# Patient Record
Sex: Female | Born: 1950 | Race: White | Hispanic: No | Marital: Married | Smoking: Never smoker
Health system: Southern US, Community
[De-identification: ages and names within clinical notes are randomized; demographics above are authoritative.]

## PROBLEM LIST (undated history)

## (undated) DIAGNOSIS — I10 Essential (primary) hypertension: Secondary | ICD-10-CM

## (undated) DIAGNOSIS — Z8719 Personal history of other diseases of the digestive system: Secondary | ICD-10-CM

## (undated) HISTORY — PX: ABDOMINAL HYSTERECTOMY: SHX81

## (undated) HISTORY — PX: TOTAL KNEE ARTHROPLASTY: SHX125

## (undated) HISTORY — PX: KNEE ARTHROSCOPY: SUR90

## (undated) HISTORY — PX: ADENOIDECTOMY: SUR15

## (undated) HISTORY — PX: KIDNEY DONATION: SHX685

## (undated) HISTORY — PX: OTHER SURGICAL HISTORY: SHX169

---

## 2015-01-24 ENCOUNTER — Other Ambulatory Visit: Payer: Self-pay

## 2015-01-24 DIAGNOSIS — Z1231 Encounter for screening mammogram for malignant neoplasm of breast: Secondary | ICD-10-CM

## 2015-03-06 ENCOUNTER — Ambulatory Visit
Admission: RE | Admit: 2015-03-06 | Discharge: 2015-03-06 | Disposition: A | Discharge status: Home / Self Care | Payer: PPO | Source: Ambulatory Visit

## 2015-03-06 DIAGNOSIS — Z1231 Encounter for screening mammogram for malignant neoplasm of breast: Secondary | ICD-10-CM

## 2015-04-26 DIAGNOSIS — H2512 Age-related nuclear cataract, left eye: Secondary | ICD-10-CM | POA: Diagnosis not present

## 2015-05-04 NOTE — Patient Instructions (Signed)
Your procedure is scheduled on: 05/09/2015  Report to [facility] at  700   AM.  Call this number if you have problems the morning of surgery: [phone]   Do not eat food or drink liquids :After Midnight.      Take these medicines the morning of surgery with A SIP OF WATER: none   Do not wear jewelry, make-up or nail polish.  Do not wear lotions, powders, or perfumes. You may wear deodorant.  Do not shave 48 hours prior to surgery.  Do not bring valuables to the hospital.  Contacts, dentures or bridgework may not be worn into surgery.  Leave suitcase in the car. After surgery it may be brought to your room.  For patients admitted to the hospital, checkout time is 11:00 AM the day of discharge.   Patients discharged the day of surgery will not be allowed to drive home.  :     Please read over the following fact sheets that you were given: Coughing and Deep Breathing, Surgical Site Infection Prevention, Anesthesia Post-op Instructions and Care and Recovery After Surgery    Cataract A cataract is a clouding of the lens of the eye. When a lens becomes cloudy, vision is reduced based on the degree and nature of the clouding. Many cataracts reduce vision to some degree. Some cataracts make people more near-sighted as they develop. Other cataracts increase glare. Cataracts that are ignored and become worse can sometimes look white. The white color can be seen through the pupil. CAUSES   Aging. However, cataracts may occur at any age, even in newborns.   Certain drugs.   Trauma to the eye.   Certain diseases such as diabetes.   Specific eye diseases such as chronic inflammation inside the eye or a sudden attack of a rare form of glaucoma.   Inherited or acquired medical problems.  SYMPTOMS   Gradual, progressive drop in vision in the affected eye.   Severe, rapid visual loss. This most often happens when trauma is the cause.  DIAGNOSIS  To detect a cataract, an eye doctor examines  the lens. Cataracts are best diagnosed with an exam of the eyes with the pupils enlarged (dilated) by drops.  TREATMENT  For an early cataract, vision may improve by using different eyeglasses or stronger lighting. If that does not help your vision, surgery is the only effective treatment. A cataract needs to be surgically removed when vision loss interferes with your everyday activities, such as driving, reading, or watching TV. A cataract may also have to be removed if it prevents examination or treatment of another eye problem. Surgery removes the cloudy lens and usually replaces it with a substitute lens (intraocular lens, IOL).  At a time when both you and your doctor agree, the cataract will be surgically removed. If you have cataracts in both eyes, only one is usually removed at a time. This allows the operated eye to heal and be out of danger from any possible problems after surgery (such as infection or poor wound healing). In rare cases, a cataract may be doing damage to your eye. In these cases, your caregiver may advise surgical removal right away. The vast majority of people who have cataract surgery have better vision afterward. HOME CARE INSTRUCTIONS  If you are not planning surgery, you may be asked to do the following:  Use different eyeglasses.   Use stronger or brighter lighting.   Ask your eye doctor about reducing your medicine dose  or changing medicines if it is thought that a medicine caused your cataract. Changing medicines does not make the cataract go away on its own.   Become familiar with your surroundings. Poor vision can lead to injury. Avoid bumping into things on the affected side. You are at a higher risk for tripping or falling.   Exercise extreme care when driving or operating machinery.   Wear sunglasses if you are sensitive to bright light or experiencing problems with glare.  SEEK IMMEDIATE MEDICAL CARE IF:   You have a worsening or sudden vision loss.    You notice redness, swelling, or increasing pain in the eye.   You have a fever.  Document Released: 03/11/2005 Document Revised: 02/28/2011 Document Reviewed: 11/02/2010 [facility] Patient Information 2012 [place].PATIENT INSTRUCTIONS POST-ANESTHESIA  IMMEDIATELY FOLLOWING SURGERY:  Do not drive or operate machinery for the first twenty four hours after surgery.  Do not make any important decisions for twenty four hours after surgery or while taking narcotic pain medications or sedatives.  If you develop intractable nausea and vomiting or a severe headache please notify your doctor immediately.  FOLLOW-UP:  Please make an appointment with your surgeon as instructed. You do not need to follow up with anesthesia unless specifically instructed to do so.  WOUND CARE INSTRUCTIONS (if applicable):  Keep a dry clean dressing on the anesthesia/puncture wound site if there is drainage.  Once the wound has quit draining you may leave it open to air.  Generally you should leave the bandage intact for twenty four hours unless there is drainage.  If the epidural site drains for more than 36-48 hours please call the anesthesia department.  QUESTIONS?:  Please feel free to call your physician or the hospital operator if you have any questions, and they will be happy to assist you.

## 2015-05-05 ENCOUNTER — Other Ambulatory Visit: Payer: Self-pay

## 2015-05-05 ENCOUNTER — Encounter (HOSPITAL_COMMUNITY): Payer: Self-pay

## 2015-05-05 ENCOUNTER — Encounter (HOSPITAL_COMMUNITY)
Admission: RE | Admit: 2015-05-05 | Discharge: 2015-05-05 | Disposition: A | Discharge status: Home / Self Care | Payer: PPO | Source: Ambulatory Visit | Attending: Ophthalmology | Admitting: Ophthalmology

## 2015-05-05 HISTORY — DX: Personal history of other diseases of the digestive system: Z87.19

## 2015-05-05 NOTE — Pre-Procedure Instructions (Signed)
Patient given information to sign up for my chart at home. 

## 2015-05-08 MED ORDER — PHENYLEPHRINE HCL 2.5 % OP SOLN
OPHTHALMIC | Status: AC
  Filled 2015-05-08: qty 15

## 2015-05-08 MED ORDER — CYCLOPENTOLATE-PHENYLEPHRINE OP SOLN OPTIME - NO CHARGE
OPHTHALMIC | Status: AC
  Filled 2015-05-08: qty 2

## 2015-05-08 MED ORDER — TETRACAINE HCL 0.5 % OP SOLN
OPHTHALMIC | Status: AC
  Filled 2015-05-08: qty 4

## 2015-05-08 MED ORDER — KETOROLAC TROMETHAMINE 0.5 % OP SOLN
OPHTHALMIC | Status: AC
  Filled 2015-05-08: qty 5

## 2015-05-09 ENCOUNTER — Encounter (HOSPITAL_COMMUNITY)
Admission: RE | Disposition: A | Discharge status: Home / Self Care | Payer: Self-pay | Source: Ambulatory Visit | Attending: Ophthalmology

## 2015-05-09 ENCOUNTER — Ambulatory Visit (HOSPITAL_COMMUNITY): Payer: PPO | Admitting: Anesthesiology

## 2015-05-09 ENCOUNTER — Encounter (HOSPITAL_COMMUNITY): Payer: Self-pay | Admitting: Anesthesiology

## 2015-05-09 ENCOUNTER — Ambulatory Visit (HOSPITAL_COMMUNITY)
Admission: RE | Admit: 2015-05-09 | Discharge: 2015-05-09 | Disposition: A | Discharge status: Home / Self Care | Payer: PPO | Source: Ambulatory Visit | Attending: Ophthalmology | Admitting: Ophthalmology

## 2015-05-09 DIAGNOSIS — H269 Unspecified cataract: Secondary | ICD-10-CM | POA: Diagnosis not present

## 2015-05-09 DIAGNOSIS — H2511 Age-related nuclear cataract, right eye: Secondary | ICD-10-CM | POA: Diagnosis not present

## 2015-05-09 DIAGNOSIS — M1991 Primary osteoarthritis, unspecified site: Secondary | ICD-10-CM | POA: Insufficient documentation

## 2015-05-09 DIAGNOSIS — H2512 Age-related nuclear cataract, left eye: Secondary | ICD-10-CM | POA: Diagnosis not present

## 2015-05-09 DIAGNOSIS — H268 Other specified cataract: Secondary | ICD-10-CM | POA: Diagnosis not present

## 2015-05-09 HISTORY — PX: CATARACT EXTRACTION W/PHACO: SHX586

## 2015-05-09 LAB — GLUCOSE, CAPILLARY: Glucose-Capillary: 102 mg/dL — ABNORMAL HIGH (ref 65–99)

## 2015-05-09 SURGERY — PHACOEMULSIFICATION, CATARACT, WITH IOL INSERTION
Anesthesia: Monitor Anesthesia Care | Site: Eye | Laterality: Left

## 2015-05-09 MED ORDER — KETOROLAC TROMETHAMINE 0.5 % OP SOLN
1.0000 [drp] | OPHTHALMIC | Status: AC
  Administered 2015-05-09 (×3): 1 [drp] via OPHTHALMIC

## 2015-05-09 MED ORDER — FENTANYL CITRATE (PF) 100 MCG/2ML IJ SOLN
25.0000 ug | Freq: Once | INTRAMUSCULAR | Status: AC
  Administered 2015-05-09: 25 ug via INTRAVENOUS

## 2015-05-09 MED ORDER — CYCLOPENTOLATE-PHENYLEPHRINE 0.2-1 % OP SOLN
1.0000 [drp] | OPHTHALMIC | Status: AC
  Administered 2015-05-09 (×3): 1 [drp] via OPHTHALMIC

## 2015-05-09 MED ORDER — PROVISC 10 MG/ML IO SOLN
INTRAOCULAR | Status: DC | PRN
  Administered 2015-05-09: 0.85 mL via INTRAOCULAR

## 2015-05-09 MED ORDER — FENTANYL CITRATE (PF) 100 MCG/2ML IJ SOLN
INTRAMUSCULAR | Status: AC
  Filled 2015-05-09: qty 2

## 2015-05-09 MED ORDER — PHENYLEPHRINE HCL 2.5 % OP SOLN
1.0000 [drp] | OPHTHALMIC | Status: AC
  Administered 2015-05-09 (×3): 1 [drp] via OPHTHALMIC

## 2015-05-09 MED ORDER — BSS IO SOLN
INTRAOCULAR | Status: DC | PRN
  Administered 2015-05-09: 15 mL

## 2015-05-09 MED ORDER — MIDAZOLAM HCL 2 MG/2ML IJ SOLN
INTRAMUSCULAR | Status: AC
  Filled 2015-05-09: qty 2

## 2015-05-09 MED ORDER — MIDAZOLAM HCL 2 MG/2ML IJ SOLN
1.0000 mg | INTRAMUSCULAR | Status: DC | PRN
  Administered 2015-05-09: 1 mg via INTRAVENOUS

## 2015-05-09 MED ORDER — EPINEPHRINE HCL 1 MG/ML IJ SOLN
INTRAOCULAR | Status: DC | PRN
  Administered 2015-05-09: 500 mL

## 2015-05-09 MED ORDER — EPINEPHRINE HCL 1 MG/ML IJ SOLN
INTRAMUSCULAR | Status: AC
  Filled 2015-05-09: qty 1

## 2015-05-09 MED ORDER — TETRACAINE 0.5 % OP SOLN OPTIME - NO CHARGE
OPHTHALMIC | Status: DC | PRN
  Administered 2015-05-09: 2 [drp] via OPHTHALMIC

## 2015-05-09 MED ORDER — TETRACAINE HCL 0.5 % OP SOLN
1.0000 [drp] | OPHTHALMIC | Status: AC
  Administered 2015-05-09 (×3): 1 [drp] via OPHTHALMIC

## 2015-05-09 MED ORDER — LACTATED RINGERS IV SOLN
INTRAVENOUS | Status: DC
  Administered 2015-05-09: 09:00:00 via INTRAVENOUS

## 2015-05-09 SURGICAL SUPPLY — 10 items
CLOTH BEACON ORANGE TIMEOUT ST (SAFETY) ×2 IMPLANT
EYE SHIELD UNIVERSAL CLEAR (GAUZE/BANDAGES/DRESSINGS) ×2 IMPLANT
GLOVE BIOGEL PI IND STRL 7.0 (GLOVE) ×1 IMPLANT
GLOVE BIOGEL PI INDICATOR 7.0 (GLOVE) ×1
GLOVE EXAM NITRILE MD LF STRL (GLOVE) ×2 IMPLANT
LENS ALC ACRYL/TECN (Ophthalmic Related) ×2 IMPLANT
PAD ARMBOARD 7.5X6 YLW CONV (MISCELLANEOUS) ×2 IMPLANT
TAPE SURG TRANSPORE 1 IN (GAUZE/BANDAGES/DRESSINGS) ×1 IMPLANT
TAPE SURGICAL TRANSPORE 1 IN (GAUZE/BANDAGES/DRESSINGS) ×1
WATER STERILE IRR 250ML POUR (IV SOLUTION) ×2 IMPLANT

## 2015-05-09 NOTE — Anesthesia Preprocedure Evaluation (Addendum)
Anesthesia Evaluation  Patient identified by MRN, date of birth, ID band Patient awake    Reviewed: Allergy & Precautions, NPO status , Patient's Chart, lab work & pertinent test results  Airway Mallampati: II  TM Distance: >3 FB     Dental  (+) Teeth Intact   Pulmonary neg pulmonary ROS,    breath sounds clear to auscultation       Cardiovascular negative cardio ROS   Rhythm:Regular Rate:Normal     Neuro/Psych    GI/Hepatic hiatal hernia,   Endo/Other    Renal/GU      Musculoskeletal   Abdominal   Peds  Hematology   Anesthesia Other Findings   Reproductive/Obstetrics                             Anesthesia Physical Anesthesia Plan  ASA: I  Anesthesia Plan: MAC   Post-op Pain Management:    Induction: Intravenous  Airway Management Planned: Nasal Cannula  Additional Equipment:   Intra-op Plan:   Post-operative Plan:   Informed Consent: I have reviewed the patients History and Physical, chart, labs and discussed the procedure including the risks, benefits and alternatives for the proposed anesthesia with the patient or authorized representative who has indicated his/her understanding and acceptance.     Plan Discussed with:   Anesthesia Plan Comments:         Anesthesia Quick Evaluation  

## 2015-05-09 NOTE — Op Note (Signed)
Patient brought to the operating room and prepped and draped in the usual manner.  Lid speculum inserted in left eye.  Stab incision made at the twelve o'clock position.  Provisc instilled in the anterior chamber.   A 2.4 mm. Stab incision was made temporally.  An anterior capsulotomy was done with a bent 25 gauge needle.  The nucleus was hydrodissected.  The Phaco tip was inserted in the anterior chamber and the nucleus was emulsified.  CDE was 4.83.  The cortical material was then removed with the I and A tip.  Posterior capsule was the polished.  The anterior chamber was deepened with Provisc.  A 26.5 Diopter Alcon SN60WF IOL was then inserted in the capsular bag.  Provisc was then removed with the I and A tip.  The wound was then hydrated.  Patient sent to the Recovery Room in good condition with follow up in my office.  Preoperative Diagnosis:  Nuclear Cataract OS Postoperative Diagnosis:  Same Procedure name: Kelman Phacoemulsification OS with IOL

## 2015-05-09 NOTE — Discharge Instructions (Signed)
°  °          [facility] Instructions °[address]- [place] [address]-[place] °    ° °1. Avoid closing eyes tightly. One often closes the eye tightly when laughing, talking, sneezing, coughing or if they feel irritated. At these times, you should be careful not to close your eyes tightly. ° °2. Instill eye drops as instructed. To instill drops in your eye, open it, look up and have someone gently pull the lower lid down and instill a couple of drops inside the lower lid. ° °3. Do not touch upper lid. ° °4. Take Advil or Tylenol for pain. ° °5. You may use either eye for near work, such as reading or sewing and you may watch television. ° °6. You may have your hair done at the beauty parlor at any time. ° °7. Wear dark glasses with or without your own glasses if you are in bright light. ° °8. Call our office at [phone] or [phone] if you have sharp pain in your eye or unusual symptoms. ° °9.  FOLLOW UP WITH DR. SHAPIRO TODAY IN HIS [place] OFFICE AT 2:45pm. ° °  °I have received a copy of the above instructions and will follow them.  ° ° ° °IF YOU ARE IN IMMEDIATE DANGER CALL 911! ° °It is important for you to keep your follow-up appointment with your physician after discharge, OR, for you /your caregiver to make a follow-up appointment with your physician / medical provider after discharge. ° °Show these instructions to the next healthcare provider you see. °PATIENT INSTRUCTIONS °POST-ANESTHESIA ° °IMMEDIATELY FOLLOWING SURGERY:  Do not drive or operate machinery for the first twenty four hours after surgery.  Do not make any important decisions for twenty four hours after surgery or while taking narcotic pain medications or sedatives.  If you develop intractable nausea and vomiting or a severe headache please notify your doctor immediately. ° °FOLLOW-UP:  Please make an appointment with your surgeon as instructed. You do not need to follow up with anesthesia unless  specifically instructed to do so. ° °WOUND CARE INSTRUCTIONS (if applicable):  Keep a dry clean dressing on the anesthesia/puncture wound site if there is drainage.  Once the wound has quit draining you may leave it open to air.  Generally you should leave the bandage intact for twenty four hours unless there is drainage.  If the epidural site drains for more than 36-48 hours please call the anesthesia department. ° °QUESTIONS?:  Please feel free to call your physician or the hospital operator if you have any questions, and they will be happy to assist you.    ° ° ° °

## 2015-05-09 NOTE — Transfer of Care (Signed)
Immediate Anesthesia Transfer of Care Note  Patient: Kristina Mcpherson  Procedure(s) Performed: Procedure(s) with comments: CATARACT EXTRACTION PHACO AND INTRAOCULAR LENS PLACEMENT (IOC) (Left) - CDE:4.83  Patient Location: Short Stay  Anesthesia Type:MAC  Level of Consciousness: awake, alert , oriented and patient cooperative  Airway & Oxygen Therapy: Patient Spontanous Breathing  Post-op Assessment: Report given to RN, Post -op Vital signs reviewed and stable and Patient moving all extremities  Post vital signs: Reviewed and stable  Last Vitals:  Filed Vitals:   05/09/15 0900 05/09/15 0905  BP: 197/93 169/131  Pulse:    Temp:    Resp: 33 18    Complications: No apparent anesthesia complications

## 2015-05-09 NOTE — H&P (Signed)
The patient was re examined and there is no change in the patients condition since the original H and P. 

## 2015-05-09 NOTE — Anesthesia Postprocedure Evaluation (Signed)
Anesthesia Post Note  Patient: Kristina Mcpherson  Procedure(s) Performed: Procedure(s) (LRB): CATARACT EXTRACTION PHACO AND INTRAOCULAR LENS PLACEMENT (IOC) (Left)  Patient location during evaluation: Short Stay Anesthesia Type: MAC Level of consciousness: awake and alert, oriented and patient cooperative Pain management: pain level controlled Vital Signs Assessment: post-procedure vital signs reviewed and stable Respiratory status: spontaneous breathing Cardiovascular status: blood pressure returned to baseline and stable Postop Assessment: adequate PO intake and no signs of nausea or vomiting Anesthetic complications: no    Last Vitals:  Filed Vitals:   05/09/15 0900 05/09/15 0905  BP: 197/93 169/131  Pulse:    Temp:    Resp: 33 18    Last Pain: There were no vitals filed for this visit.               [name] J

## 2015-05-10 ENCOUNTER — Encounter (HOSPITAL_COMMUNITY): Payer: Self-pay | Admitting: Ophthalmology

## 2015-05-18 ENCOUNTER — Encounter (HOSPITAL_COMMUNITY): Payer: Self-pay | Admitting: *Deleted

## 2015-05-19 ENCOUNTER — Encounter (HOSPITAL_COMMUNITY): Admission: RE | Admit: 2015-05-19 | Payer: PPO | Source: Ambulatory Visit

## 2015-05-23 ENCOUNTER — Ambulatory Visit (HOSPITAL_COMMUNITY): Payer: PPO | Admitting: Anesthesiology

## 2015-05-23 ENCOUNTER — Encounter (HOSPITAL_COMMUNITY): Payer: Self-pay | Admitting: *Deleted

## 2015-05-23 ENCOUNTER — Encounter (HOSPITAL_COMMUNITY)
Admission: RE | Disposition: A | Discharge status: Home / Self Care | Payer: Self-pay | Source: Ambulatory Visit | Attending: Ophthalmology

## 2015-05-23 ENCOUNTER — Ambulatory Visit (HOSPITAL_COMMUNITY)
Admission: RE | Admit: 2015-05-23 | Discharge: 2015-05-23 | Disposition: A | Discharge status: Home / Self Care | Payer: PPO | Source: Ambulatory Visit | Attending: Ophthalmology | Admitting: Ophthalmology

## 2015-05-23 DIAGNOSIS — H268 Other specified cataract: Secondary | ICD-10-CM | POA: Insufficient documentation

## 2015-05-23 DIAGNOSIS — H269 Unspecified cataract: Secondary | ICD-10-CM | POA: Diagnosis not present

## 2015-05-23 DIAGNOSIS — H2511 Age-related nuclear cataract, right eye: Secondary | ICD-10-CM | POA: Diagnosis not present

## 2015-05-23 DIAGNOSIS — M1991 Primary osteoarthritis, unspecified site: Secondary | ICD-10-CM | POA: Diagnosis not present

## 2015-05-23 HISTORY — PX: CATARACT EXTRACTION W/PHACO: SHX586

## 2015-05-23 SURGERY — PHACOEMULSIFICATION, CATARACT, WITH IOL INSERTION
Anesthesia: Monitor Anesthesia Care | Site: Eye | Laterality: Right

## 2015-05-23 MED ORDER — PROVISC 10 MG/ML IO SOLN
INTRAOCULAR | Status: DC | PRN
  Administered 2015-05-23: 0.85 mL via INTRAOCULAR

## 2015-05-23 MED ORDER — EPINEPHRINE HCL 1 MG/ML IJ SOLN
INTRAMUSCULAR | Status: AC
  Filled 2015-05-23: qty 1

## 2015-05-23 MED ORDER — TETRACAINE 0.5 % OP SOLN OPTIME - NO CHARGE
OPHTHALMIC | Status: DC | PRN
  Administered 2015-05-23: 2 [drp] via OPHTHALMIC

## 2015-05-23 MED ORDER — KETOROLAC TROMETHAMINE 0.5 % OP SOLN
1.0000 [drp] | OPHTHALMIC | Status: AC | PRN
  Administered 2015-05-23 (×3): 1 [drp] via OPHTHALMIC

## 2015-05-23 MED ORDER — BSS IO SOLN
INTRAOCULAR | Status: DC | PRN
  Administered 2015-05-23: 15 mL

## 2015-05-23 MED ORDER — PHENYLEPHRINE HCL 2.5 % OP SOLN
1.0000 [drp] | OPHTHALMIC | Status: AC | PRN
  Administered 2015-05-23 (×3): 1 [drp] via OPHTHALMIC

## 2015-05-23 MED ORDER — EPINEPHRINE HCL 1 MG/ML IJ SOLN
INTRAOCULAR | Status: DC | PRN
  Administered 2015-05-23: 500 mL

## 2015-05-23 MED ORDER — MIDAZOLAM HCL 2 MG/2ML IJ SOLN
INTRAMUSCULAR | Status: AC
  Filled 2015-05-23: qty 2

## 2015-05-23 MED ORDER — TETRACAINE HCL 0.5 % OP SOLN
1.0000 [drp] | OPHTHALMIC | Status: AC | PRN
  Administered 2015-05-23 (×3): 1 [drp] via OPHTHALMIC

## 2015-05-23 MED ORDER — CYCLOPENTOLATE-PHENYLEPHRINE 0.2-1 % OP SOLN
1.0000 [drp] | OPHTHALMIC | Status: AC | PRN
  Administered 2015-05-23 (×3): 1 [drp] via OPHTHALMIC

## 2015-05-23 MED ORDER — MIDAZOLAM HCL 2 MG/2ML IJ SOLN
1.0000 mg | Freq: Once | INTRAMUSCULAR | Status: AC
  Administered 2015-05-23: 0.5 mg via INTRAVENOUS

## 2015-05-23 MED ORDER — LACTATED RINGERS IV SOLN
INTRAVENOUS | Status: DC
  Administered 2015-05-23: 08:00:00 via INTRAVENOUS

## 2015-05-23 SURGICAL SUPPLY — 10 items
CLOTH BEACON ORANGE TIMEOUT ST (SAFETY) ×2 IMPLANT
EYE SHIELD UNIVERSAL CLEAR (GAUZE/BANDAGES/DRESSINGS) ×2 IMPLANT
GLOVE BIOGEL PI IND STRL 7.0 (GLOVE) ×1 IMPLANT
GLOVE BIOGEL PI INDICATOR 7.0 (GLOVE) ×1
GLOVE EXAM NITRILE MD LF STRL (GLOVE) ×2 IMPLANT
LENS ALC ACRYL/TECN (Ophthalmic Related) ×2 IMPLANT
PAD ARMBOARD 7.5X6 YLW CONV (MISCELLANEOUS) ×2 IMPLANT
TAPE SURG TRANSPORE 1 IN (GAUZE/BANDAGES/DRESSINGS) ×1 IMPLANT
TAPE SURGICAL TRANSPORE 1 IN (GAUZE/BANDAGES/DRESSINGS) ×1
WATER STERILE IRR 250ML POUR (IV SOLUTION) ×2 IMPLANT

## 2015-05-23 NOTE — Anesthesia Postprocedure Evaluation (Signed)
  Anesthesia Post-op Note  Patient: Kristina Mcpherson  Procedure(s) Performed: Procedure(s) (LRB): CATARACT EXTRACTION PHACO AND INTRAOCULAR LENS PLACEMENT (IOC) (Right)  Patient Location:  Short Stay  Anesthesia Type: MAC  Level of Consciousness: awake  Airway and Oxygen Therapy: Patient Spontanous Breathing  Post-op Pain: none  Post-op Assessment: Post-op Vital signs reviewed, Patient's Cardiovascular Status Stable, Respiratory Function Stable, Patent Airway, No signs of Nausea or vomiting and Pain level controlled  Post-op Vital Signs: Reviewed and stable  Complications: No apparent anesthesia complications

## 2015-05-23 NOTE — H&P (Signed)
The patient was re examined and there is no change in the patients condition since the original H and P. 

## 2015-05-23 NOTE — Anesthesia Preprocedure Evaluation (Signed)
Anesthesia Evaluation  Patient identified by MRN, date of birth, ID band Patient awake    Reviewed: Allergy & Precautions, NPO status , Patient's Chart, lab work & pertinent test results  Airway Mallampati: II  TM Distance: >3 FB     Dental  (+) Teeth Intact   Pulmonary neg pulmonary ROS,    breath sounds clear to auscultation       Cardiovascular negative cardio ROS   Rhythm:Regular Rate:Normal     Neuro/Psych    GI/Hepatic hiatal hernia,   Endo/Other    Renal/GU      Musculoskeletal   Abdominal   Peds  Hematology   Anesthesia Other Findings   Reproductive/Obstetrics                             Anesthesia Physical Anesthesia Plan  ASA: I  Anesthesia Plan: MAC   Post-op Pain Management:    Induction: Intravenous  Airway Management Planned: Nasal Cannula  Additional Equipment:   Intra-op Plan:   Post-operative Plan:   Informed Consent: I have reviewed the patients History and Physical, chart, labs and discussed the procedure including the risks, benefits and alternatives for the proposed anesthesia with the patient or authorized representative who has indicated his/her understanding and acceptance.     Plan Discussed with:   Anesthesia Plan Comments:         Anesthesia Quick Evaluation

## 2015-05-23 NOTE — Transfer of Care (Signed)
Immediate Anesthesia Transfer of Care Note  Patient: Kristina Mcpherson  Procedure(s) Performed: Procedure(s) (LRB): CATARACT EXTRACTION PHACO AND INTRAOCULAR LENS PLACEMENT (IOC) (Right)  Patient Location: Shortstay  Anesthesia Type: MAC  Level of Consciousness: awake  Airway & Oxygen Therapy: Patient Spontanous Breathing   Post-op Assessment: Report given to PACU RN, Post -op Vital signs reviewed and stable and Patient moving all extremities  Post vital signs: Reviewed and stable  Complications: No apparent anesthesia complications

## 2015-05-23 NOTE — Anesthesia Procedure Notes (Signed)
Procedure Name: MAC Date/Time: 05/23/2015 8:42 AM Performed by: Franco Nones Pre-anesthesia Checklist: Patient identified, Emergency Drugs available, Suction available, Timeout performed and Patient being monitored Patient Re-evaluated:Patient Re-evaluated prior to inductionOxygen Delivery Method: Nasal Cannula

## 2015-05-23 NOTE — Discharge Instructions (Signed)
°  °          [facility] Instructions °[address]- [place] [address]-[place] °    ° °1. Avoid closing eyes tightly. One often closes the eye tightly when laughing, talking, sneezing, coughing or if they feel irritated. At these times, you should be careful not to close your eyes tightly. ° °2. Instill eye drops as instructed. To instill drops in your eye, open it, look up and have someone gently pull the lower lid down and instill a couple of drops inside the lower lid. ° °3. Do not touch upper lid. ° °4. Take Advil or Tylenol for pain. ° °5. You may use either eye for near work, such as reading or sewing and you may watch television. ° °6. You may have your hair done at the beauty parlor at any time. ° °7. Wear dark glasses with or without your own glasses if you are in bright light. ° °8. Call our office at [phone] or [phone] if you have sharp pain in your eye or unusual symptoms. ° °9.  FOLLOW UP WITH DR. SHAPIRO TODAY IN HIS  OFFICE AT 2:45pm. ° °  °I have received a copy of the above instructions and will follow them.  ° ° ° °IF YOU ARE IN IMMEDIATE DANGER CALL 911! ° °It is important for you to keep your follow-up appointment with your physician after discharge, OR, for you /your caregiver to make a follow-up appointment with your physician / medical provider after discharge. ° °Show these instructions to the next healthcare provider you see. ° °

## 2015-05-23 NOTE — Op Note (Signed)
Patient brought to the operating room and prepped and draped in the usual manner.  Lid speculum inserted in right eye.  Stab incision made at the twelve o'clock position.  Provisc instilled in the anterior chamber.   A 2.4 mm. Stab incision was made temporally.  An anterior capsulotomy was done with a bent 25 gauge needle.  The nucleus was hydrodissected.  The Phaco tip was inserted in the anterior chamber and the nucleus was emulsified.  CDE was 7.81.  The cortical material was then removed with the I and A tip.  Posterior capsule was the polished.  The anterior chamber was deepened with Provisc.  A 23.5 Diopter Alcon SN60WF IOL was then inserted in the capsular bag.  Provisc was then removed with the I and A tip.  The wound was then hydrated.  Patient sent to the Recovery Room in good condition with follow up in my office.  Preoperative Diagnosis:  Nuclear Cataract OD Postoperative Diagnosis:  Same Procedure name: Kelman Phacoemulsification OD with IOL

## 2015-05-24 ENCOUNTER — Encounter (HOSPITAL_COMMUNITY): Payer: Self-pay | Admitting: Ophthalmology

## 2015-06-14 DIAGNOSIS — Z0000 Encounter for general adult medical examination without abnormal findings: Secondary | ICD-10-CM | POA: Diagnosis not present

## 2015-06-14 DIAGNOSIS — Z6833 Body mass index (BMI) 33.0-33.9, adult: Secondary | ICD-10-CM | POA: Diagnosis not present

## 2015-06-16 DIAGNOSIS — R7301 Impaired fasting glucose: Secondary | ICD-10-CM | POA: Diagnosis not present

## 2015-06-16 DIAGNOSIS — E782 Mixed hyperlipidemia: Secondary | ICD-10-CM | POA: Diagnosis not present

## 2015-06-23 DIAGNOSIS — I10 Essential (primary) hypertension: Secondary | ICD-10-CM | POA: Diagnosis not present

## 2015-06-23 DIAGNOSIS — R7301 Impaired fasting glucose: Secondary | ICD-10-CM | POA: Diagnosis not present

## 2015-08-04 DIAGNOSIS — I10 Essential (primary) hypertension: Secondary | ICD-10-CM | POA: Diagnosis not present

## 2015-08-04 DIAGNOSIS — L309 Dermatitis, unspecified: Secondary | ICD-10-CM | POA: Diagnosis not present

## 2015-08-30 DIAGNOSIS — I10 Essential (primary) hypertension: Secondary | ICD-10-CM | POA: Diagnosis not present

## 2015-10-03 DIAGNOSIS — Z961 Presence of intraocular lens: Secondary | ICD-10-CM | POA: Diagnosis not present

## 2015-10-09 DIAGNOSIS — I10 Essential (primary) hypertension: Secondary | ICD-10-CM | POA: Diagnosis not present

## 2015-10-11 DIAGNOSIS — I10 Essential (primary) hypertension: Secondary | ICD-10-CM | POA: Diagnosis not present

## 2015-12-26 DIAGNOSIS — R05 Cough: Secondary | ICD-10-CM | POA: Diagnosis not present

## 2015-12-26 DIAGNOSIS — J060 Acute laryngopharyngitis: Secondary | ICD-10-CM | POA: Diagnosis not present

## 2016-03-05 ENCOUNTER — Other Ambulatory Visit: Payer: Self-pay | Admitting: Internal Medicine

## 2016-03-05 DIAGNOSIS — Z1231 Encounter for screening mammogram for malignant neoplasm of breast: Secondary | ICD-10-CM

## 2016-04-05 DIAGNOSIS — E782 Mixed hyperlipidemia: Secondary | ICD-10-CM | POA: Diagnosis not present

## 2016-04-05 DIAGNOSIS — R7301 Impaired fasting glucose: Secondary | ICD-10-CM | POA: Diagnosis not present

## 2016-04-09 ENCOUNTER — Ambulatory Visit: Payer: PPO

## 2016-04-17 DIAGNOSIS — Z6832 Body mass index (BMI) 32.0-32.9, adult: Secondary | ICD-10-CM | POA: Diagnosis not present

## 2016-04-17 DIAGNOSIS — I10 Essential (primary) hypertension: Secondary | ICD-10-CM | POA: Diagnosis not present

## 2016-04-17 DIAGNOSIS — R7303 Prediabetes: Secondary | ICD-10-CM | POA: Diagnosis not present

## 2016-04-17 DIAGNOSIS — Z0001 Encounter for general adult medical examination with abnormal findings: Secondary | ICD-10-CM | POA: Diagnosis not present

## 2016-04-26 ENCOUNTER — Ambulatory Visit
Admission: RE | Admit: 2016-04-26 | Discharge: 2016-04-26 | Disposition: A | Discharge status: Home / Self Care | Payer: PPO | Source: Ambulatory Visit | Attending: Internal Medicine | Admitting: Internal Medicine

## 2016-04-26 DIAGNOSIS — Z1231 Encounter for screening mammogram for malignant neoplasm of breast: Secondary | ICD-10-CM

## 2016-09-10 DIAGNOSIS — J0100 Acute maxillary sinusitis, unspecified: Secondary | ICD-10-CM | POA: Diagnosis not present

## 2016-10-08 DIAGNOSIS — Z961 Presence of intraocular lens: Secondary | ICD-10-CM | POA: Diagnosis not present

## 2016-10-10 DIAGNOSIS — I10 Essential (primary) hypertension: Secondary | ICD-10-CM | POA: Diagnosis not present

## 2016-10-10 DIAGNOSIS — Z1159 Encounter for screening for other viral diseases: Secondary | ICD-10-CM | POA: Diagnosis not present

## 2016-10-10 DIAGNOSIS — R7301 Impaired fasting glucose: Secondary | ICD-10-CM | POA: Diagnosis not present

## 2016-10-23 DIAGNOSIS — M545 Low back pain: Secondary | ICD-10-CM | POA: Diagnosis not present

## 2016-10-23 DIAGNOSIS — R7303 Prediabetes: Secondary | ICD-10-CM | POA: Diagnosis not present

## 2016-10-23 DIAGNOSIS — R05 Cough: Secondary | ICD-10-CM | POA: Diagnosis not present

## 2016-10-23 DIAGNOSIS — I10 Essential (primary) hypertension: Secondary | ICD-10-CM | POA: Diagnosis not present

## 2016-10-23 DIAGNOSIS — Z6832 Body mass index (BMI) 32.0-32.9, adult: Secondary | ICD-10-CM | POA: Diagnosis not present

## 2016-10-23 DIAGNOSIS — Z23 Encounter for immunization: Secondary | ICD-10-CM | POA: Diagnosis not present

## 2016-11-01 DIAGNOSIS — Z23 Encounter for immunization: Secondary | ICD-10-CM | POA: Diagnosis not present

## 2017-02-07 ENCOUNTER — Encounter: Payer: Self-pay | Admitting: Gastroenterology

## 2017-02-07 DIAGNOSIS — Z01419 Encounter for gynecological examination (general) (routine) without abnormal findings: Secondary | ICD-10-CM | POA: Diagnosis not present

## 2017-02-07 DIAGNOSIS — Z1272 Encounter for screening for malignant neoplasm of vagina: Secondary | ICD-10-CM | POA: Diagnosis not present

## 2017-03-26 ENCOUNTER — Other Ambulatory Visit: Payer: Self-pay | Admitting: Internal Medicine

## 2017-03-26 DIAGNOSIS — Z1231 Encounter for screening mammogram for malignant neoplasm of breast: Secondary | ICD-10-CM

## 2017-04-29 ENCOUNTER — Ambulatory Visit
Admission: RE | Admit: 2017-04-29 | Discharge: 2017-04-29 | Disposition: A | Discharge status: Home / Self Care | Payer: PPO | Source: Ambulatory Visit | Attending: Internal Medicine | Admitting: Internal Medicine

## 2017-04-29 DIAGNOSIS — Z1231 Encounter for screening mammogram for malignant neoplasm of breast: Secondary | ICD-10-CM | POA: Diagnosis not present

## 2017-05-01 DIAGNOSIS — I10 Essential (primary) hypertension: Secondary | ICD-10-CM | POA: Diagnosis not present

## 2017-05-01 DIAGNOSIS — R7301 Impaired fasting glucose: Secondary | ICD-10-CM | POA: Diagnosis not present

## 2017-05-02 DIAGNOSIS — S0101XA Laceration without foreign body of scalp, initial encounter: Secondary | ICD-10-CM | POA: Diagnosis not present

## 2017-05-06 DIAGNOSIS — R7301 Impaired fasting glucose: Secondary | ICD-10-CM | POA: Diagnosis not present

## 2017-05-06 DIAGNOSIS — I10 Essential (primary) hypertension: Secondary | ICD-10-CM | POA: Diagnosis not present

## 2017-05-06 DIAGNOSIS — Z0001 Encounter for general adult medical examination with abnormal findings: Secondary | ICD-10-CM | POA: Diagnosis not present

## 2017-05-09 ENCOUNTER — Other Ambulatory Visit (HOSPITAL_COMMUNITY): Payer: Self-pay | Admitting: Internal Medicine

## 2017-05-09 DIAGNOSIS — Z780 Asymptomatic menopausal state: Secondary | ICD-10-CM

## 2017-05-11 DIAGNOSIS — J111 Influenza due to unidentified influenza virus with other respiratory manifestations: Secondary | ICD-10-CM | POA: Diagnosis not present

## 2017-05-11 DIAGNOSIS — R110 Nausea: Secondary | ICD-10-CM | POA: Diagnosis not present

## 2017-05-11 DIAGNOSIS — M791 Myalgia, unspecified site: Secondary | ICD-10-CM | POA: Diagnosis not present

## 2017-05-11 DIAGNOSIS — Z6833 Body mass index (BMI) 33.0-33.9, adult: Secondary | ICD-10-CM | POA: Diagnosis not present

## 2017-05-11 DIAGNOSIS — R05 Cough: Secondary | ICD-10-CM | POA: Diagnosis not present

## 2017-05-15 ENCOUNTER — Ambulatory Visit (HOSPITAL_COMMUNITY)
Admission: RE | Admit: 2017-05-15 | Discharge: 2017-05-15 | Disposition: A | Discharge status: Home / Self Care | Payer: PPO | Source: Ambulatory Visit | Attending: Internal Medicine | Admitting: Internal Medicine

## 2017-05-15 DIAGNOSIS — M85832 Other specified disorders of bone density and structure, left forearm: Secondary | ICD-10-CM | POA: Diagnosis not present

## 2017-05-15 DIAGNOSIS — Z780 Asymptomatic menopausal state: Secondary | ICD-10-CM | POA: Diagnosis not present

## 2017-05-23 ENCOUNTER — Encounter: Payer: Self-pay | Admitting: Internal Medicine

## 2017-05-28 ENCOUNTER — Ambulatory Visit (INDEPENDENT_AMBULATORY_CARE_PROVIDER_SITE_OTHER): Payer: PPO

## 2017-05-28 DIAGNOSIS — Z1211 Encounter for screening for malignant neoplasm of colon: Secondary | ICD-10-CM

## 2017-05-28 MED ORDER — PEG 3350-KCL-NA BICARB-NACL 420 G PO SOLR: 4000.0000 mL | ORAL | 0 refills | Status: DC

## 2017-05-28 MED ORDER — NA SULFATE-K SULFATE-MG SULF 17.5-3.13-1.6 GM/177ML PO SOLN: 1.0000 | ORAL | 0 refills | Status: DC

## 2017-05-28 NOTE — Progress Notes (Signed)
Ok to  Schedule?

## 2017-05-28 NOTE — Progress Notes (Signed)
Gastroenterology Pre-Procedure Review  Request Date:05/28/17 Requesting Physician: Dr.Zach Hal ( had tcs in [place] over 10 years ago, not sure who MD was but said she didn't have any polyps)  pts husband is in need of a kidney and the pt is giving him one of hers. They are doing the transplant April 16th. Pt has to have tcs prior to surgery.   PATIENT REVIEW QUESTIONS: The patient responded to the following health history questions as indicated:    1. Diabetes Melitis: no 2. Joint replacements in the past 12 months: no 3. Major health problems in the past 3 months: no 4. Has an artificial valve or MVP: no 5. Has a defibrillator: no 6. Has been advised in past to take antibiotics in advance of a procedure like teeth cleaning: no 7. Family history of colon cancer: no  8. Alcohol Use: no 9. History of sleep apnea: no  10. History of coronary artery or other vascular stents placed within the last 12 months: no 11. History of any prior anesthesia complications: no    MEDICATIONS & ALLERGIES:    Patient reports the following regarding taking any blood thinners:   Plavix? no Aspirin? no Coumadin? no Brilinta? no Xarelto? no Eliquis? no Pradaxa? no Savaysa? no Effient? no  Patient confirms/reports the following medications:  Current Outpatient Medications  Medication Sig Dispense Refill  . Multiple Vitamins-Minerals (MULTIVITAMINS THER. W/MINERALS) TABS tablet Take 1 tablet by mouth daily.    Marland Kitchen. losartan (COZAAR) 100 MG tablet Take by mouth 2 (two) times daily.     No current facility-administered medications for this visit.     Patient confirms/reports the following allergies:  Allergies  Allergen Reactions  . Codeine Nausea Only    No orders of the defined types were placed in this encounter.   AUTHORIZATION INFORMATION Primary Insurance: [email or web address] advantageID #: [id number] Pre-Cert / Auth required:no   SCHEDULE INFORMATION: Procedure has been scheduled as follows:   Date: 06/18/17, Time: 2:00 Location: [facility]  This Gastroenterology Pre-Precedure Review Form is being routed to the following provider(s): Wynne DustEric Gill NP

## 2017-05-28 NOTE — Patient Instructions (Addendum)
Kristina Mcpherson   [birth year] MRN: [id number]    Procedure Date: 06/18/17 Time to register: 1:00 Place to register: [facility] Procedure Time: 2:00 Scheduled provider: Barney Drain, MD  PREPARATION FOR COLONOSCOPY WITH TRI-LYTE SPLIT PREP  Please notify us immediately if you are diabetic, take iron supplements, or if you are on Coumadin or any other blood thinners.     You will need to purchase 1 fleet enema and 1 box of Bisacodyl 78m tablets.   1 DAY BEFORE PROCEDURE:  DATE: 06/17/17  DAY: Tuesday Continue clear liquids the entire day - NO SOLID FOOD.    At 2:00 pm:  Take 2 Bisacodyl tablets.   At 4:00pm:  Start drinking your solution. Make sure you mix well per instructions on the bottle. Try to drink 1 (one) 8 ounce glass every 10-15 minutes until you have consumed HALF the jug. You should complete by 6:00pm.You must keep the left over solution refrigerated until completed next day.  Continue clear liquids. You must drink plenty of clear liquids to prevent dehyration and kidney failure. Nothing to eat or drink after midnight.  EXCEPTION: If you take medications for your heart, blood pressure or breathing, you may take these medications with a small amount of clear liquid.    DAY OF PROCEDURE:   DATE: 06/18/17   DAY: Wednesday    Five hours before your procedure time @ 9:00am:  Finish remaining amout of bowel prep, drinking 1 (one) 8 ounce glass every 10-15 minutes until complete. You have two hours to consume remaining prep.   Three hours before your procedure time _0 :00am:  Nothing by mouth.   At least one hour before going to the hospital:  Give yourself one Fleet enema. You may take your morning medications with sip of water unless we have instructed otherwise.      Please see below for Dietary Information.  CLEAR LIQUIDS INCLUDE:  Water Jello (NOT red in color)   Ice Popsicles (NOT red in color)   Tea (sugar ok, no milk/cream) Powdered fruit  flavored drinks  Coffee (sugar ok, no milk/cream) Gatorade/ Lemonade/ Kool-Aid  (NOT red in color)   Juice: apple, white grape, white cranberry Soft drinks  Clear bullion, consomme, broth (fat free beef/chicken/vegetable)  Carbonated beverages (any kind)  Strained chicken noodle soup Hard Candy   Remember: Clear liquids are liquids that will allow you to see your fingers on the other side of a clear glass. Be sure liquids are NOT red in color, and not cloudy, but CLEAR.  DO NOT EAT OR DRINK ANY OF THE FOLLOWING:  Dairy products of any kind   Cranberry juice Tomato juice / V8 juice   Grapefruit juice Orange juice     Red grape juice  Do not eat any solid foods, including such foods as: cereal, oatmeal, yogurt, fruits, vegetables, creamed soups, eggs, bread, crackers, pureed foods in a blender, etc.   HELPFUL HINTS FOR DRINKING PREP SOLUTION:   Make sure prep is extremely cold. Mix and refrigerate the the morning of the prep. You may also put in the freezer.   You may try mixing some Crystal Light or Country Time Lemonade if you prefer. Mix in small amounts; add more if necessary.  Try drinking through a straw  Rinse mouth with water or a mouthwash between glasses, to remove after-taste.  Try sipping on a cold beverage /ice/ popsicles between glasses of prep.  Place a piece of sugar-free hard candy in  mouth between glasses.  If you become nauseated, try consuming smaller amounts, or stretch out the time between glasses. Stop for 30-60 minutes, then slowly start back drinking.        OTHER INSTRUCTIONS  You will need a responsible adult at least 68 years of age to accompany you and drive you home. This person must remain in the waiting room during your procedure. The hospital will cancel your procedure if you do not have a responsible adult with you.   1. Wear loose fitting clothing that is easily removed. 2. Leave jewelry and other valuables at home.  3. Remove all body  piercing jewelry and leave at home. 4. Total time from sign-in until discharge is approximately 2-3 hours. 5. You should go home directly after your procedure and rest. You can resume normal activities the day after your procedure. 6. The day of your procedure you should not:  Drive  Make legal decisions  Operate machinery  Drink alcohol  Return to work   You may call the office (Dept: [phone]) before 5:00pm, or page the doctor on call [phone]) after 5:00pm, for further instructions, if necessary.   Insurance Information YOU WILL NEED TO CHECK WITH YOUR INSURANCE COMPANY FOR THE BENEFITS OF COVERAGE YOU HAVE FOR THIS PROCEDURE.  UNFORTUNATELY, NOT ALL INSURANCE COMPANIES HAVE BENEFITS TO COVER ALL OR PART OF THESE TYPES OF PROCEDURES.  IT IS YOUR RESPONSIBILITY TO CHECK YOUR BENEFITS, HOWEVER, WE WILL BE GLAD TO ASSIST YOU WITH ANY CODES YOUR INSURANCE COMPANY MAY NEED.    PLEASE NOTE THAT MOST INSURANCE COMPANIES WILL NOT COVER A SCREENING COLONOSCOPY FOR PEOPLE UNDER THE AGE OF 50  IF YOU HAVE [employer] INSURANCE, YOU MAY HAVE BENEFITS FOR A SCREENING COLONOSCOPY BUT IF POLYPS ARE FOUND THE DIAGNOSIS WILL CHANGE AND THEN YOU MAY HAVE A DEDUCTIBLE THAT WILL NEED TO BE MET. SO PLEASE MAKE SURE YOU CHECK YOUR BENEFITS FOR A SCREENING COLONOSCOPY AS WELL AS A DIAGNOSTIC COLONOSCOPY.

## 2017-05-28 NOTE — Progress Notes (Signed)
According to computer, suprep was preferred on pt formulary. I sent it in but the pt called back and said the copay was too high and she couldn't afford it. I have sent in rx for trilyte. New instructions printed and mailed to the pt.

## 2017-06-11 ENCOUNTER — Ambulatory Visit: Payer: PPO

## 2017-06-12 ENCOUNTER — Telehealth: Payer: Self-pay

## 2017-06-12 NOTE — Telephone Encounter (Signed)
Tried to call pt- NA- LMOM 

## 2017-06-12 NOTE — Telephone Encounter (Signed)
Pt had a question about the stool softener that is part of her prep. Please call her back at [phone]

## 2017-06-12 NOTE — Telephone Encounter (Signed)
Spoke with the pt and she is aware of what she needs to get for her procedure.

## 2017-06-18 ENCOUNTER — Ambulatory Visit (HOSPITAL_COMMUNITY)
Admission: RE | Admit: 2017-06-18 | Discharge: 2017-06-18 | Disposition: A | Discharge status: Home / Self Care | Payer: PPO | Source: Ambulatory Visit | Attending: Gastroenterology | Admitting: Gastroenterology

## 2017-06-18 ENCOUNTER — Encounter (HOSPITAL_COMMUNITY)
Admission: RE | Disposition: A | Discharge status: Home / Self Care | Payer: Self-pay | Source: Ambulatory Visit | Attending: Gastroenterology

## 2017-06-18 ENCOUNTER — Telehealth: Payer: Self-pay | Admitting: Gastroenterology

## 2017-06-18 ENCOUNTER — Other Ambulatory Visit: Payer: Self-pay

## 2017-06-18 ENCOUNTER — Encounter (HOSPITAL_COMMUNITY): Payer: Self-pay | Admitting: *Deleted

## 2017-06-18 DIAGNOSIS — Z1212 Encounter for screening for malignant neoplasm of rectum: Secondary | ICD-10-CM | POA: Diagnosis not present

## 2017-06-18 DIAGNOSIS — K621 Rectal polyp: Secondary | ICD-10-CM | POA: Insufficient documentation

## 2017-06-18 DIAGNOSIS — Z823 Family history of stroke: Secondary | ICD-10-CM | POA: Insufficient documentation

## 2017-06-18 DIAGNOSIS — I10 Essential (primary) hypertension: Secondary | ICD-10-CM | POA: Diagnosis not present

## 2017-06-18 DIAGNOSIS — Z1211 Encounter for screening for malignant neoplasm of colon: Secondary | ICD-10-CM | POA: Diagnosis not present

## 2017-06-18 DIAGNOSIS — Z96651 Presence of right artificial knee joint: Secondary | ICD-10-CM | POA: Insufficient documentation

## 2017-06-18 DIAGNOSIS — Z8249 Family history of ischemic heart disease and other diseases of the circulatory system: Secondary | ICD-10-CM | POA: Diagnosis not present

## 2017-06-18 DIAGNOSIS — Z79899 Other long term (current) drug therapy: Secondary | ICD-10-CM | POA: Diagnosis not present

## 2017-06-18 DIAGNOSIS — Z885 Allergy status to narcotic agent status: Secondary | ICD-10-CM | POA: Diagnosis not present

## 2017-06-18 DIAGNOSIS — K649 Unspecified hemorrhoids: Secondary | ICD-10-CM | POA: Diagnosis not present

## 2017-06-18 DIAGNOSIS — Z820 Family history of epilepsy and other diseases of the nervous system: Secondary | ICD-10-CM | POA: Insufficient documentation

## 2017-06-18 DIAGNOSIS — Z9842 Cataract extraction status, left eye: Secondary | ICD-10-CM | POA: Insufficient documentation

## 2017-06-18 DIAGNOSIS — Z806 Family history of leukemia: Secondary | ICD-10-CM | POA: Diagnosis not present

## 2017-06-18 DIAGNOSIS — K648 Other hemorrhoids: Secondary | ICD-10-CM | POA: Insufficient documentation

## 2017-06-18 DIAGNOSIS — Z9841 Cataract extraction status, right eye: Secondary | ICD-10-CM | POA: Diagnosis not present

## 2017-06-18 DIAGNOSIS — K449 Diaphragmatic hernia without obstruction or gangrene: Secondary | ICD-10-CM | POA: Diagnosis not present

## 2017-06-18 HISTORY — DX: Essential (primary) hypertension: I10

## 2017-06-18 HISTORY — PX: COLONOSCOPY: SHX5424

## 2017-06-18 SURGERY — COLONOSCOPY
Anesthesia: Moderate Sedation

## 2017-06-18 MED ORDER — MEPERIDINE HCL 100 MG/ML IJ SOLN
INTRAMUSCULAR | Status: AC
  Filled 2017-06-18: qty 2

## 2017-06-18 MED ORDER — MIDAZOLAM HCL 5 MG/5ML IJ SOLN
INTRAMUSCULAR | Status: AC
  Filled 2017-06-18: qty 10

## 2017-06-18 MED ORDER — MIDAZOLAM HCL 5 MG/5ML IJ SOLN
INTRAMUSCULAR | Status: DC | PRN
  Administered 2017-06-18: 2 mg via INTRAVENOUS
  Administered 2017-06-18 (×2): 1 mg via INTRAVENOUS

## 2017-06-18 MED ORDER — MEPERIDINE HCL 100 MG/ML IJ SOLN
INTRAMUSCULAR | Status: DC | PRN
  Administered 2017-06-18: 25 mg
  Administered 2017-06-18: 50 mg

## 2017-06-18 MED ORDER — SODIUM CHLORIDE 0.9 % IV SOLN
INTRAVENOUS | Status: DC
  Administered 2017-06-18: 1000 mL via INTRAVENOUS

## 2017-06-18 NOTE — H&P (Signed)
Primary Care Physician:  Benita StabileHall, John Z, MD Primary Gastroenterologist:  Dr. Darrick PennaFields  Pre-Procedure History & Physical: HPI:  Kristina Mcpherson Strength is a 67 y.o. female here for COLON CANCER SCREENING.  Past Medical History:  Diagnosis Date  . History of hiatal hernia   . Hypertension     Past Surgical History:  Procedure Laterality Date  . ABDOMINAL HYSTERECTOMY    . ADENOIDECTOMY    . CATARACT EXTRACTION W/PHACO Left 05/09/2015   Procedure: CATARACT EXTRACTION PHACO AND INTRAOCULAR LENS PLACEMENT (IOC);  Surgeon: Jethro BolusMark Shapiro, MD;  Location: [facility];  Service: Ophthalmology;  Laterality: Left;  CDE:4.83  . CATARACT EXTRACTION W/PHACO Right 05/23/2015   Procedure: CATARACT EXTRACTION PHACO AND INTRAOCULAR LENS PLACEMENT (IOC);  Surgeon: Jethro BolusMark Shapiro, MD;  Location: [facility];  Service: Ophthalmology;  Laterality: Right;  CDE:7.81  . KNEE ARTHROSCOPY Right   . shaving bone foot Right    5th toe  . TOTAL KNEE ARTHROPLASTY Right     Prior to Admission medications   Medication Sig Start Date End Date Taking? Authorizing Provider  Alpha-Lipoic Acid 300 MG CAPS Take 1 capsule by mouth daily.   Yes [provider]  Biotin 5 MG TABS Take 1 tablet by mouth daily.   Yes [provider]  Borage, Borago officinalis, (BORAGE OIL PO) Take 600 mg by mouth daily.   Yes [provider]  Calcium Carbonate-Vitamin D (CALTRATE 600+D PO) Take 1 tablet by mouth daily.   Yes [provider]  CINNAMON PO Take 600 mg by mouth daily.   Yes [provider]  Janett BillowGreen Tea, Camillia sinensis, (GREEN TEA PO) Take 500 mg by mouth daily.   Yes [provider]  losartan-hydrochlorothiazide (HYZAAR) 100-12.5 MG tablet Take 0.5 tablets by mouth 2 (two) times daily.   Yes [provider]  MILK THISTLE PO Take 1 capsule by mouth daily. 80%   Yes [provider]  Multiple Vitamins-Minerals (MULTIVITAMINS THER. W/MINERALS) TABS tablet Take 1 tablet by mouth daily.    Yes [provider]  multivitamin-lutein (OCUVITE-LUTEIN) CAPS capsule Take 1 capsule by mouth daily.   Yes [provider]  Probiotic Product (PROBIOTIC DAILY PO) Take 1 capsule by mouth daily.   Yes [provider]    Allergies as of 05/28/2017 - Review Complete 05/28/2017  Allergen Reaction Noted  . Codeine Nausea Only 04/26/2015    Family History  Problem Relation Age of Onset  . Leukemia Mother   . Coronary artery disease Mother   . Hypertension Mother   . Hypercholesterolemia Mother   . Dementia Mother   . Diabetes Mother   . Hypertension Father   . Hypercholesterolemia Father   . Transient ischemic attack Father   . Coronary artery disease Brother     Social History   Socioeconomic History  . Marital status: Married    Spouse name: Not on file  . Number of children: Not on file  . Years of education: Not on file  . Highest education level: Not on file  Occupational History  . Not on file  Social Needs  . Financial resource strain: Not on file  . Food insecurity:    Worry: Not on file    Inability: Not on file  . Transportation needs:    Medical: Not on file    Non-medical: Not on file  Tobacco Use  . Smoking status: Never Smoker  . Smokeless tobacco: Never Used  Substance and Sexual Activity  . Alcohol use: Yes  Comment: rare  . Drug use: No  . Sexual activity: Never    Birth control/protection: Surgical  Lifestyle  . Physical activity:    Days per week: Not on file    Minutes per session: Not on file  . Stress: Not on file  Relationships  . Social connections:    Talks on phone: Not on file    Gets together: Not on file    Attends religious service: Not on file    Active member of club or organization: Not on file    Attends meetings of clubs or organizations: Not on file    Relationship status: Not on file  . Intimate partner violence:    Fear of current or ex partner: Not on file    Emotionally abused: Not on  file    Physically abused: Not on file    Forced sexual activity: Not on file  Other Topics Concern  . Not on file  Social History Narrative  . Not on file    Review of Systems: See HPI, otherwise negative ROS   Physical Exam: BP 138/69   Pulse 84   Temp 97.7 F (36.5 C) (Oral)   Resp 15   Ht 5' (1.524 m)   Wt 188 lb (85.3 kg)   SpO2 99%   BMI 36.72 kg/m  General:   Alert,  pleasant and cooperative in NAD Head:  Normocephalic and atraumatic. Neck:  Supple; Lungs:  Clear throughout to auscultation.    Heart:  Regular rate and rhythm. Abdomen:  Soft, nontender and nondistended. Normal bowel sounds, without guarding, and without rebound.   Neurologic:  Alert and  oriented x4;  grossly normal neurologically.  Impression/Plan:     SCREENING  Plan:  1. TCS TODAY DISCUSSED PROCEDURE, BENEFITS, & RISKS: < 1% chance of medication reaction, bleeding, perforation, or rupture of spleen/liver.

## 2017-06-18 NOTE — Telephone Encounter (Signed)
SEND TCS REPORT MAR 27 TO DR.COLLEE SHEEHAN [email or web address] . FAX #: [phone].

## 2017-06-18 NOTE — H&P (Signed)
Primary Care Physician:  Benita StabileHall, John Z, MD Primary Gastroenterologist:  Dr. Darrick PennaFields  Pre-Procedure History & Physical: HPI:  Kristina Mcpherson is a 67 y.o. female here for COLON CANCER SCREENING.  Past Medical History:  Diagnosis Date  . History of hiatal hernia   . Hypertension     Past Surgical History:  Procedure Laterality Date  . ABDOMINAL HYSTERECTOMY    . ADENOIDECTOMY    . CATARACT EXTRACTION W/PHACO Left 05/09/2015   Procedure: CATARACT EXTRACTION PHACO AND INTRAOCULAR LENS PLACEMENT (IOC);  Surgeon: Jethro BolusMark Shapiro, MD;  Location: [facility];  Service: Ophthalmology;  Laterality: Left;  CDE:4.83  . CATARACT EXTRACTION W/PHACO Right 05/23/2015   Procedure: CATARACT EXTRACTION PHACO AND INTRAOCULAR LENS PLACEMENT (IOC);  Surgeon: Jethro BolusMark Shapiro, MD;  Location: [facility];  Service: Ophthalmology;  Laterality: Right;  CDE:7.81  . KNEE ARTHROSCOPY Right   . shaving bone foot Right    5th toe  . TOTAL KNEE ARTHROPLASTY Right     Prior to Admission medications   Medication Sig Start Date End Date Taking? Authorizing Provider  Alpha-Lipoic Acid 300 MG CAPS Take 1 capsule by mouth daily.   Yes [provider]  Biotin 5 MG TABS Take 1 tablet by mouth daily.   Yes [provider]  Borage, Borago officinalis, (BORAGE OIL PO) Take 600 mg by mouth daily.   Yes [provider]  Calcium Carbonate-Vitamin D (CALTRATE 600+D PO) Take 1 tablet by mouth daily.   Yes [provider]  CINNAMON PO Take 600 mg by mouth daily.   Yes [provider]  Janett BillowGreen Tea, Camillia sinensis, (GREEN TEA PO) Take 500 mg by mouth daily.   Yes [provider]  losartan-hydrochlorothiazide (HYZAAR) 100-12.5 MG tablet Take 0.5 tablets by mouth 2 (two) times daily.   Yes [provider]  MILK THISTLE PO Take 1 capsule by mouth daily. 80%   Yes [provider]  Multiple Vitamins-Minerals (MULTIVITAMINS THER. W/MINERALS) TABS tablet Take 1 tablet by mouth daily.    Yes [provider]  multivitamin-lutein (OCUVITE-LUTEIN) CAPS capsule Take 1 capsule by mouth daily.   Yes [provider]  Probiotic Product (PROBIOTIC DAILY PO) Take 1 capsule by mouth daily.   Yes [provider]    Allergies as of 05/28/2017 - Review Complete 05/28/2017  Allergen Reaction Noted  . Codeine Nausea Only 04/26/2015    Family History  Problem Relation Age of Onset  . Leukemia Mother   . Coronary artery disease Mother   . Hypertension Mother   . Hypercholesterolemia Mother   . Dementia Mother   . Diabetes Mother   . Hypertension Father   . Hypercholesterolemia Father   . Transient ischemic attack Father   . Coronary artery disease Brother     Social History   Socioeconomic History  . Marital status: Married    Spouse name: Not on file  . Number of children: Not on file  . Years of education: Not on file  . Highest education level: Not on file  Occupational History  . Not on file  Social Needs  . Financial resource strain: Not on file  . Food insecurity:    Worry: Not on file    Inability: Not on file  . Transportation needs:    Medical: Not on file    Non-medical: Not on file  Tobacco Use  . Smoking status: Never Smoker  . Smokeless tobacco: Never Used  Substance and Sexual Activity  . Alcohol use: Yes  Comment: rare  . Drug use: No  . Sexual activity: Never    Birth control/protection: Surgical  Lifestyle  . Physical activity:    Days per week: Not on file    Minutes per session: Not on file  . Stress: Not on file  Relationships  . Social connections:    Talks on phone: Not on file    Gets together: Not on file    Attends religious service: Not on file    Active member of club or organization: Not on file    Attends meetings of clubs or organizations: Not on file    Relationship status: Not on file  . Intimate partner violence:    Fear of current or ex partner: Not on file    Emotionally abused: Not on  file    Physically abused: Not on file    Forced sexual activity: Not on file  Other Topics Concern  . Not on file  Social History Narrative  . Not on file    Review of Systems: See HPI, otherwise negative ROS   Physical Exam: BP 113/69   Pulse 85   Temp 97.7 F (36.5 C) (Oral)   Resp 10   Ht 5' (1.524 m)   Wt 188 lb (85.3 kg)   SpO2 98%   BMI 36.72 kg/m  General:   Alert,  pleasant and cooperative in NAD Head:  Normocephalic and atraumatic. Neck:  Supple; Lungs:  Clear throughout to auscultation.    Heart:  Regular rate and rhythm. Abdomen:  Soft, nontender and nondistended. Normal bowel sounds, without guarding, and without rebound.   Neurologic:  Alert and  oriented x4;  grossly normal neurologically.  Impression/Plan:     SCREENING  Plan:  1. TCS TODAY DISCUSSED PROCEDURE, BENEFITS, & RISKS: < 1% chance of medication reaction, bleeding, perforation, or rupture of spleen/liver.

## 2017-06-18 NOTE — Discharge Instructions (Signed)
You had 3 small RECTAL polyps removed. You have  SMALL internal hemorrhoids.   DRINK WATER TO KEEP YOUR URINE LIGHT YELLOW.  CONTINUE YOUR WEIGHT LOSS EFFORTS.  WHILE I DO NOT WANT TO ALARM YOU, YOUR BODY MASS INDEX IS OVER 30 WHICH MEANS YOU ARE OBESE. OBESITY IS ASSOCIATED WITH AN INCREASED RISK FOR CIRRHOSIS AND ALL CANCERS, INCLUDING ESOPHAGEAL AND COLON CANCER. A WEIGHT OF 175 LBS OR LESS  WILL KEEP YOUR BODY MASS INDEX(BMI) UNDER 30.  FOLLOW A HIGH FIBER DIET. AVOID ITEMS THAT CAUSE BLOATING & GAS. SEE INFO BELOW.  YOUR BIOPSY RESULTS WILL BE AVAILABLE IN 7 DAYS.  Next colonoscopy in 5-10 years.    Colonoscopy Care After Read the instructions outlined below and refer to this sheet in the next week. These discharge instructions provide you with general information on caring for yourself after you leave the hospital. While your treatment has been planned according to the most current medical practices available, unavoidable complications occasionally occur. If you have any problems or questions after discharge, call DR. Jimya Ciani, [phone].  ACTIVITY  You may resume your regular activity, but move at a slower pace for the next 24 hours.   Take frequent rest periods for the next 24 hours.   Walking will help get rid of the air and reduce the bloated feeling in your belly (abdomen).   No driving for 24 hours (because of the medicine (anesthesia) used during the test).   You may shower.   Do not sign any important legal documents or operate any machinery for 24 hours (because of the anesthesia used during the test).    NUTRITION  Drink plenty of fluids.   You may resume your normal diet as instructed by your doctor.   Begin with a light meal and progress to your normal diet. Heavy or fried foods are harder to digest and may make you feel sick to your stomach (nauseated).   Avoid alcoholic beverages for 24 hours or as instructed.    MEDICATIONS  You may resume your  normal medications.   WHAT YOU CAN EXPECT TODAY  Some feelings of bloating in the abdomen.   Passage of more gas than usual.   Spotting of blood in your stool or on the toilet paper  .  IF YOU HAD POLYPS REMOVED DURING THE COLONOSCOPY:  Eat a soft diet IF YOU HAVE NAUSEA, BLOATING, ABDOMINAL PAIN, OR VOMITING.    FINDING OUT THE RESULTS OF YOUR TEST Not all test results are available during your visit. DR. Darrick PennaFIELDS WILL CALL YOU WITHIN 14 DAYS OF YOUR PROCEDUE WITH YOUR RESULTS. Do not assume everything is normal if you have not heard from DR. Yaslin Kirtley, CALL HER OFFICE AT [phone].  SEEK IMMEDIATE MEDICAL ATTENTION AND CALL THE OFFICE: [phone] IF:  You have more than a spotting of blood in your stool.   Your belly is swollen (abdominal distention).   You are nauseated or vomiting.   You have a temperature over 101F.   You have abdominal pain or discomfort that is severe or gets worse throughout the day.   High-Fiber Diet A high-fiber diet changes your normal diet to include more whole grains, legumes, fruits, and vegetables. Changes in the diet involve replacing refined carbohydrates with unrefined foods. The calorie level of the diet is essentially unchanged. The Dietary Reference Intake (recommended amount) for adult males is 38 grams per day. For adult females, it is 25 grams per day. Pregnant and lactating women should consume 28  grams of fiber per day. Fiber is the intact part of a plant that is not broken down during digestion. Functional fiber is fiber that has been isolated from the plant to provide a beneficial effect in the body. PURPOSE  Increase stool bulk.   Ease and regulate bowel movements.   Lower cholesterol.   REDUCE RISK OF COLON CANCER  INDICATIONS THAT YOU NEED MORE FIBER  Constipation and hemorrhoids.   Uncomplicated diverticulosis (intestine condition) and irritable bowel syndrome.   Weight management.   As a protective measure against  hardening of the arteries (atherosclerosis), diabetes, and cancer.   GUIDELINES FOR INCREASING FIBER IN THE DIET  Start adding fiber to the diet slowly. A gradual increase of about 5 more grams (2 slices of whole-wheat bread, 2 servings of most fruits or vegetables, or 1 bowl of high-fiber cereal) per day is best. Too rapid an increase in fiber may result in constipation, flatulence, and bloating.   Drink enough water and fluids to keep your urine clear or pale yellow. Water, juice, or caffeine-free drinks are recommended. Not drinking enough fluid may cause constipation.   Eat a variety of high-fiber foods rather than one type of fiber.   Try to increase your intake of fiber through using high-fiber foods rather than fiber pills or supplements that contain small amounts of fiber.   The goal is to change the types of food eaten. Do not supplement your present diet with high-fiber foods, but replace foods in your present diet.   INCLUDE A VARIETY OF FIBER SOURCES  Replace refined and processed grains with whole grains, canned fruits with fresh fruits, and incorporate other fiber sources. White rice, white breads, and most bakery goods contain little or no fiber.   Brown whole-grain rice, buckwheat oats, and many fruits and vegetables are all good sources of fiber. These include: broccoli, Brussels sprouts, cabbage, cauliflower, beets, sweet potatoes, white potatoes (skin on), carrots, tomatoes, eggplant, squash, berries, fresh fruits, and dried fruits.   Cereals appear to be the richest source of fiber. Cereal fiber is found in whole grains and bran. Bran is the fiber-rich outer coat of cereal grain, which is largely removed in refining. In whole-grain cereals, the bran remains. In breakfast cereals, the largest amount of fiber is found in those with "bran" in their names. The fiber content is sometimes indicated on the label.   You may need to include additional fruits and vegetables each day.     In baking, for 1 cup white flour, you may use the following substitutions:   1 cup whole-wheat flour minus 2 tablespoons.   1/2 cup white flour plus 1/2 cup whole-wheat flour.   Polyps, Colon  A polyp is extra tissue that grows inside your body. Colon polyps grow in the large intestine. The large intestine, also called the colon, is part of your digestive system. It is a long, hollow tube at the end of your digestive tract where your body makes and stores stool. Most polyps are not dangerous. They are benign. This means they are not cancerous. But over time, some types of polyps can turn into cancer. Polyps that are smaller than a pea are usually not harmful. But larger polyps could someday become or may already be cancerous. To be safe, doctors remove all polyps and test them.   WHO GETS POLYPS? Anyone can get polyps, but certain people are more likely than others. You may have a greater chance of getting polyps if:  You are  over 50.   You have had polyps before.   Someone in your family has had polyps.   Someone in your family has had cancer of the large intestine.   Find out if someone in your family has had polyps. You may also be more likely to get polyps if you:   Eat a lot of fatty foods   Smoke   Drink alcohol   Do not exercise  Eat too much   PREVENTION There is not one sure way to prevent polyps. You might be able to lower your risk of getting them if you:  Eat more fruits and vegetables and less fatty food.   Do not smoke.   Avoid alcohol.   Exercise every day.   Lose weight if you are overweight.   Eating more calcium and folate can also lower your risk of getting polyps. Some foods that are rich in calcium are milk, cheese, and broccoli. Some foods that are rich in folate are chickpeas, kidney beans, and spinach.   Hemorrhoids Hemorrhoids are dilated (enlarged) veins around the rectum. Sometimes clots will form in the veins. This makes them swollen and  painful. These are called thrombosed hemorrhoids. Causes of hemorrhoids include:  Constipation.   Straining to have a bowel movement.   HEAVY LIFTING  HOME CARE INSTRUCTIONS  Eat a well balanced diet and drink 6 to 8 glasses of water every day to avoid constipation. You may also use a bulk laxative.   Avoid straining to have bowel movements.   Keep anal area dry and clean.   Do not use a donut shaped pillow or sit on the toilet for long periods. This increases blood pooling and pain.   Move your bowels when your body has the urge; this will require less straining and will decrease pain and pressure.

## 2017-06-18 NOTE — Op Note (Signed)
[facility] Patient Name: Kristina Mcpherson Procedure Date: 06/18/2017 1:49 PM MRN: [id number] Date of Birth: Jan 31, 1951 Attending MD: Jonette Eva MD, MD CSN: [id number] Age: 67 Admit Type: Outpatient Procedure:                Colonoscopy WITH COLD FORCEPS POLYPECTOMY Indications:              Screening for colorectal malignant neoplasm.                            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 Providers:                Jonette Eva MD, MD, Jannett Celestine, RN, Dyann Ruddle Referring MD:             Kathleene Hazel. Margo Aye MD Medicines:                Meperidine 100 mg IV, Midazolam 4 mg IV Complications:            No immediate complications. Estimated Blood Loss:     Estimated blood loss was minimal. Procedure:                Pre-Anesthesia Assessment:                           - Prior to the procedure, a History and Physical                            was performed, and patient medications and                            allergies were reviewed. The patient's tolerance of                            previous anesthesia was also reviewed. The risks                            and benefits of the procedure and the sedation                            options and risks were discussed with the patient.                            All questions were answered, and informed consent                            was obtained. Prior Anticoagulants: The patient has                            taken no previous anticoagulant or antiplatelet                            agents. ASA Grade Assessment: II - A patient with                            mild systemic disease. After reviewing the risks  and benefits, the patient was deemed in                            satisfactory condition to undergo the procedure.                           After obtaining informed consent, the colonoscope                            was passed under direct vision. Throughout the                             procedure, the patient's blood pressure, pulse, and                            oxygen saturations were monitored continuously. The                            EC-[id number] ([id number]) scope was introduced through                            the anus and advanced to the the cecum, identified                            by appendiceal orifice and ileocecal valve. The                            colonoscopy was technically difficult and complex                            due to significant looping. Successful completion                            of the procedure was aided by increasing the dose                            of sedation medication, straightening and                            shortening the scope to obtain bowel loop reduction                            and COLOWRAP. The patient tolerated the procedure                            fairly well. The quality of the bowel preparation                            was excellent. The ileocecal valve, appendiceal                            orifice, and rectum were photographed. Scope In: 2:27:52 PM Scope Out: 2:49:41 PM Scope Withdrawal Time: 0 hours 15 minutes 13 seconds  Total Procedure  Duration: 0 hours 21 minutes 49 seconds  Findings:      Three sessile polyps were found in the rectum. The polyps were 1 to 3 mm       in size. These polyps were removed with a cold biopsy forceps. Resection       and retrieval were complete.      The recto-sigmoid colon, sigmoid colon and descending colon revealed       moderately excessive looping.      Internal hemorrhoids were found during retroflexion. The hemorrhoids       were small. Impression:               - Three 1 to 3 mm polyps in the rectum, removed                            with a cold biopsy forceps. Resected and retrieved.                           - There was significant looping of the colon.                           - Internal hemorrhoids. Moderate Sedation:      Moderate (conscious)  sedation was administered by the endoscopy nurse       and supervised by the endoscopist. The following parameters were       monitored: oxygen saturation, heart rate, blood pressure, and response       to care. Total physician intraservice time was 37 minutes. Recommendation:           - Repeat colonoscopy in 5-10 years for surveillance                            W/ COLOWRAP AND ZOFRAN 4 MG IV IN PREOP.                           - High fiber diet.                           - Continue present medications.                           - Await pathology results.                           - Patient has a contact number available for                            emergencies. The signs and symptoms of potential                            delayed complications were discussed with the                            patient. Return to normal activities tomorrow.                            Written discharge instructions were provided to the  patient. Procedure Code(s):        --- Professional ---                           [id number], Colonoscopy, flexible; with biopsy, single                            or multiple                           99152, Moderate sedation services provided by the                            same physician or other qualified health care                            professional performing the diagnostic or                            therapeutic service that the sedation supports,                            requiring the presence of an independent trained                            observer to assist in the monitoring of the                            patient's level of consciousness and physiological                            status; initial 15 minutes of intraservice time,                            patient age 45 years or older                           [id number], Moderate sedation services; each additional                            15 minutes intraservice  time Diagnosis Code(s):        --- Professional ---                           Z12.11, Encounter for screening for malignant                            neoplasm of colon                           K62.1, Rectal polyp                           K64.8, Other hemorrhoids CPT copyright 2016 American Medical Association. All rights reserved. The codes documented in this report are preliminary and upon coder review may  be revised to meet current compliance  requirements. Jonette Eva, MD Jonette Eva MD, MD 06/18/2017 3:00:24 PM This report has been signed electronically. Number of Addenda: 0

## 2017-06-18 NOTE — Telephone Encounter (Signed)
Procedure report faxed

## 2017-06-22 ENCOUNTER — Telehealth: Payer: Self-pay | Admitting: Gastroenterology

## 2017-06-22 ENCOUNTER — Encounter: Payer: Self-pay | Admitting: Gastroenterology

## 2017-06-22 NOTE — Telephone Encounter (Addendum)
Please call pt. She had THREE HYPERPLASTIC POLYPS removed.   FOLLOW A HIGH FIBER DIET.   DRINK WATER TO KEEP YOUR URINE LIGHT YELLOW.  CONTINUE YOUR WEIGHT LOSS EFFORTS.   A WEIGHT OF 175 LBS OR LESS  WILL KEEP YOUR BODY MASS INDEX(BMI) UNDER 30.  Next colonoscopy in 10-15 years IF THE BENEFITS OUTWEIGHS THE RISKS.   FAX PATH REPORT TO DR. Willette PaSHEEHAN AT [phone]

## 2017-06-23 ENCOUNTER — Encounter (HOSPITAL_COMMUNITY): Payer: Self-pay | Admitting: Gastroenterology

## 2017-06-23 NOTE — Telephone Encounter (Signed)
LMOM to call.

## 2017-06-23 NOTE — Telephone Encounter (Signed)
Pt is aware. Forwarding to Darl PikesSusan to fax to Dr. Willette PaSheehan.

## 2017-06-23 NOTE — Telephone Encounter (Signed)
ON RECALL  °

## 2017-06-26 NOTE — Telephone Encounter (Signed)
Faxed to Dr Willette PaSheehan and reminder in epic

## 2017-07-08 DIAGNOSIS — Z524 Kidney donor: Secondary | ICD-10-CM | POA: Diagnosis not present

## 2017-10-28 DIAGNOSIS — Z961 Presence of intraocular lens: Secondary | ICD-10-CM | POA: Diagnosis not present

## 2017-11-10 DIAGNOSIS — H6121 Impacted cerumen, right ear: Secondary | ICD-10-CM | POA: Diagnosis not present

## 2017-11-10 DIAGNOSIS — H6503 Acute serous otitis media, bilateral: Secondary | ICD-10-CM | POA: Diagnosis not present

## 2017-11-10 DIAGNOSIS — R42 Dizziness and giddiness: Secondary | ICD-10-CM | POA: Diagnosis not present

## 2017-11-10 DIAGNOSIS — Z6832 Body mass index (BMI) 32.0-32.9, adult: Secondary | ICD-10-CM | POA: Diagnosis not present

## 2017-12-25 DIAGNOSIS — H8309 Labyrinthitis, unspecified ear: Secondary | ICD-10-CM | POA: Diagnosis not present

## 2017-12-25 DIAGNOSIS — R42 Dizziness and giddiness: Secondary | ICD-10-CM | POA: Diagnosis not present

## 2017-12-26 DIAGNOSIS — R42 Dizziness and giddiness: Secondary | ICD-10-CM | POA: Diagnosis not present

## 2018-01-05 DIAGNOSIS — G3184 Mild cognitive impairment, so stated: Secondary | ICD-10-CM | POA: Diagnosis not present

## 2018-01-05 DIAGNOSIS — R42 Dizziness and giddiness: Secondary | ICD-10-CM | POA: Diagnosis not present

## 2018-01-05 DIAGNOSIS — M79674 Pain in right toe(s): Secondary | ICD-10-CM | POA: Diagnosis not present

## 2018-01-05 DIAGNOSIS — Z524 Kidney donor: Secondary | ICD-10-CM | POA: Diagnosis not present

## 2018-01-05 DIAGNOSIS — R030 Elevated blood-pressure reading, without diagnosis of hypertension: Secondary | ICD-10-CM | POA: Diagnosis not present

## 2018-01-05 DIAGNOSIS — Z6832 Body mass index (BMI) 32.0-32.9, adult: Secondary | ICD-10-CM | POA: Diagnosis not present

## 2018-01-05 DIAGNOSIS — H814 Vertigo of central origin: Secondary | ICD-10-CM | POA: Diagnosis not present

## 2018-02-05 DIAGNOSIS — R7303 Prediabetes: Secondary | ICD-10-CM | POA: Diagnosis not present

## 2018-02-05 DIAGNOSIS — I10 Essential (primary) hypertension: Secondary | ICD-10-CM | POA: Diagnosis not present

## 2018-02-05 DIAGNOSIS — R7301 Impaired fasting glucose: Secondary | ICD-10-CM | POA: Diagnosis not present

## 2018-03-11 DIAGNOSIS — R413 Other amnesia: Secondary | ICD-10-CM | POA: Diagnosis not present

## 2018-03-11 DIAGNOSIS — Z6833 Body mass index (BMI) 33.0-33.9, adult: Secondary | ICD-10-CM | POA: Diagnosis not present

## 2018-03-11 DIAGNOSIS — N183 Chronic kidney disease, stage 3 (moderate): Secondary | ICD-10-CM | POA: Diagnosis not present

## 2018-03-11 DIAGNOSIS — E669 Obesity, unspecified: Secondary | ICD-10-CM | POA: Diagnosis not present

## 2018-04-03 DIAGNOSIS — R69 Illness, unspecified: Secondary | ICD-10-CM | POA: Diagnosis not present

## 2018-04-09 ENCOUNTER — Other Ambulatory Visit: Payer: Self-pay | Admitting: Internal Medicine

## 2018-04-09 DIAGNOSIS — Z1231 Encounter for screening mammogram for malignant neoplasm of breast: Secondary | ICD-10-CM

## 2018-05-08 ENCOUNTER — Ambulatory Visit: Payer: PPO

## 2018-06-04 ENCOUNTER — Other Ambulatory Visit: Payer: Self-pay

## 2018-06-04 ENCOUNTER — Ambulatory Visit
Admission: RE | Admit: 2018-06-04 | Discharge: 2018-06-04 | Disposition: A | Discharge status: Home / Self Care | Payer: Self-pay | Source: Ambulatory Visit | Attending: Internal Medicine | Admitting: Internal Medicine

## 2018-06-04 DIAGNOSIS — Z1231 Encounter for screening mammogram for malignant neoplasm of breast: Secondary | ICD-10-CM

## 2018-07-02 DIAGNOSIS — N183 Chronic kidney disease, stage 3 (moderate): Secondary | ICD-10-CM | POA: Diagnosis not present

## 2018-07-02 DIAGNOSIS — E669 Obesity, unspecified: Secondary | ICD-10-CM | POA: Diagnosis not present

## 2018-07-02 DIAGNOSIS — I10 Essential (primary) hypertension: Secondary | ICD-10-CM | POA: Diagnosis not present

## 2018-07-02 DIAGNOSIS — R7301 Impaired fasting glucose: Secondary | ICD-10-CM | POA: Diagnosis not present

## 2018-07-02 DIAGNOSIS — R7303 Prediabetes: Secondary | ICD-10-CM | POA: Diagnosis not present

## 2018-07-09 DIAGNOSIS — N183 Chronic kidney disease, stage 3 (moderate): Secondary | ICD-10-CM | POA: Diagnosis not present

## 2018-07-09 DIAGNOSIS — Z524 Kidney donor: Secondary | ICD-10-CM | POA: Diagnosis not present

## 2018-07-09 DIAGNOSIS — E875 Hyperkalemia: Secondary | ICD-10-CM | POA: Diagnosis not present

## 2018-07-09 DIAGNOSIS — R7303 Prediabetes: Secondary | ICD-10-CM | POA: Diagnosis not present

## 2018-07-09 DIAGNOSIS — E6609 Other obesity due to excess calories: Secondary | ICD-10-CM | POA: Diagnosis not present

## 2018-07-09 DIAGNOSIS — R7301 Impaired fasting glucose: Secondary | ICD-10-CM | POA: Diagnosis not present

## 2018-08-05 DIAGNOSIS — Z0000 Encounter for general adult medical examination without abnormal findings: Secondary | ICD-10-CM | POA: Diagnosis not present

## 2018-11-02 DIAGNOSIS — H5213 Myopia, bilateral: Secondary | ICD-10-CM | POA: Diagnosis not present

## 2018-11-02 DIAGNOSIS — H524 Presbyopia: Secondary | ICD-10-CM | POA: Diagnosis not present

## 2018-11-02 DIAGNOSIS — H52203 Unspecified astigmatism, bilateral: Secondary | ICD-10-CM | POA: Diagnosis not present

## 2018-12-23 DIAGNOSIS — I10 Essential (primary) hypertension: Secondary | ICD-10-CM | POA: Diagnosis not present

## 2018-12-23 DIAGNOSIS — R7303 Prediabetes: Secondary | ICD-10-CM | POA: Diagnosis not present

## 2018-12-23 DIAGNOSIS — R7301 Impaired fasting glucose: Secondary | ICD-10-CM | POA: Diagnosis not present

## 2018-12-24 DIAGNOSIS — E875 Hyperkalemia: Secondary | ICD-10-CM | POA: Diagnosis not present

## 2018-12-24 DIAGNOSIS — E6609 Other obesity due to excess calories: Secondary | ICD-10-CM | POA: Diagnosis not present

## 2018-12-24 DIAGNOSIS — R7303 Prediabetes: Secondary | ICD-10-CM | POA: Diagnosis not present

## 2018-12-24 DIAGNOSIS — R69 Illness, unspecified: Secondary | ICD-10-CM | POA: Diagnosis not present

## 2018-12-24 DIAGNOSIS — Z6835 Body mass index (BMI) 35.0-35.9, adult: Secondary | ICD-10-CM | POA: Diagnosis not present

## 2018-12-24 DIAGNOSIS — N183 Chronic kidney disease, stage 3 unspecified: Secondary | ICD-10-CM | POA: Diagnosis not present

## 2018-12-24 DIAGNOSIS — R7301 Impaired fasting glucose: Secondary | ICD-10-CM | POA: Diagnosis not present

## 2018-12-31 DIAGNOSIS — K5000 Crohn's disease of small intestine without complications: Secondary | ICD-10-CM | POA: Diagnosis not present

## 2018-12-31 DIAGNOSIS — I10 Essential (primary) hypertension: Secondary | ICD-10-CM | POA: Diagnosis not present

## 2018-12-31 DIAGNOSIS — R69 Illness, unspecified: Secondary | ICD-10-CM | POA: Diagnosis not present

## 2019-04-05 DIAGNOSIS — R7301 Impaired fasting glucose: Secondary | ICD-10-CM | POA: Diagnosis not present

## 2019-04-05 DIAGNOSIS — I10 Essential (primary) hypertension: Secondary | ICD-10-CM | POA: Diagnosis not present

## 2019-04-05 DIAGNOSIS — N183 Chronic kidney disease, stage 3 unspecified: Secondary | ICD-10-CM | POA: Diagnosis not present

## 2019-04-05 DIAGNOSIS — R7303 Prediabetes: Secondary | ICD-10-CM | POA: Diagnosis not present

## 2019-04-06 DIAGNOSIS — R7301 Impaired fasting glucose: Secondary | ICD-10-CM | POA: Diagnosis not present

## 2019-04-06 DIAGNOSIS — E6609 Other obesity due to excess calories: Secondary | ICD-10-CM | POA: Diagnosis not present

## 2019-04-06 DIAGNOSIS — E875 Hyperkalemia: Secondary | ICD-10-CM | POA: Diagnosis not present

## 2019-04-06 DIAGNOSIS — R7303 Prediabetes: Secondary | ICD-10-CM | POA: Diagnosis not present

## 2019-04-06 DIAGNOSIS — N183 Chronic kidney disease, stage 3 unspecified: Secondary | ICD-10-CM | POA: Diagnosis not present

## 2019-04-06 DIAGNOSIS — Z136 Encounter for screening for cardiovascular disorders: Secondary | ICD-10-CM | POA: Diagnosis not present

## 2019-04-06 DIAGNOSIS — Z6835 Body mass index (BMI) 35.0-35.9, adult: Secondary | ICD-10-CM | POA: Diagnosis not present

## 2019-04-21 DIAGNOSIS — M25562 Pain in left knee: Secondary | ICD-10-CM | POA: Diagnosis not present

## 2019-04-26 ENCOUNTER — Other Ambulatory Visit: Payer: Self-pay | Admitting: Internal Medicine

## 2019-04-26 DIAGNOSIS — Z1231 Encounter for screening mammogram for malignant neoplasm of breast: Secondary | ICD-10-CM

## 2019-05-12 DIAGNOSIS — Z524 Kidney donor: Secondary | ICD-10-CM | POA: Diagnosis not present

## 2019-06-07 ENCOUNTER — Ambulatory Visit
Admission: RE | Admit: 2019-06-07 | Discharge: 2019-06-07 | Disposition: A | Discharge status: Home / Self Care | Payer: Medicare HMO | Source: Ambulatory Visit | Attending: Internal Medicine | Admitting: Internal Medicine

## 2019-06-07 ENCOUNTER — Other Ambulatory Visit: Payer: Self-pay

## 2019-06-07 DIAGNOSIS — Z1231 Encounter for screening mammogram for malignant neoplasm of breast: Secondary | ICD-10-CM

## 2019-07-08 DIAGNOSIS — H26493 Other secondary cataract, bilateral: Secondary | ICD-10-CM | POA: Diagnosis not present

## 2019-07-28 DIAGNOSIS — H26491 Other secondary cataract, right eye: Secondary | ICD-10-CM | POA: Diagnosis not present

## 2019-08-06 DIAGNOSIS — E669 Obesity, unspecified: Secondary | ICD-10-CM | POA: Diagnosis not present

## 2019-08-06 DIAGNOSIS — H902 Conductive hearing loss, unspecified: Secondary | ICD-10-CM | POA: Diagnosis not present

## 2019-08-06 DIAGNOSIS — R7301 Impaired fasting glucose: Secondary | ICD-10-CM | POA: Diagnosis not present

## 2019-08-06 DIAGNOSIS — H814 Vertigo of central origin: Secondary | ICD-10-CM | POA: Diagnosis not present

## 2019-08-06 DIAGNOSIS — G3184 Mild cognitive impairment, so stated: Secondary | ICD-10-CM | POA: Diagnosis not present

## 2019-08-06 DIAGNOSIS — I10 Essential (primary) hypertension: Secondary | ICD-10-CM | POA: Diagnosis not present

## 2019-08-06 DIAGNOSIS — J111 Influenza due to unidentified influenza virus with other respiratory manifestations: Secondary | ICD-10-CM | POA: Diagnosis not present

## 2019-08-06 DIAGNOSIS — E6609 Other obesity due to excess calories: Secondary | ICD-10-CM | POA: Diagnosis not present

## 2019-08-06 DIAGNOSIS — H6503 Acute serous otitis media, bilateral: Secondary | ICD-10-CM | POA: Diagnosis not present

## 2019-08-06 DIAGNOSIS — E875 Hyperkalemia: Secondary | ICD-10-CM | POA: Diagnosis not present

## 2019-08-06 DIAGNOSIS — H6121 Impacted cerumen, right ear: Secondary | ICD-10-CM | POA: Diagnosis not present

## 2019-08-06 DIAGNOSIS — R7303 Prediabetes: Secondary | ICD-10-CM | POA: Diagnosis not present

## 2019-08-11 DIAGNOSIS — E875 Hyperkalemia: Secondary | ICD-10-CM | POA: Diagnosis not present

## 2019-08-11 DIAGNOSIS — R002 Palpitations: Secondary | ICD-10-CM | POA: Diagnosis not present

## 2019-08-11 DIAGNOSIS — R7303 Prediabetes: Secondary | ICD-10-CM | POA: Diagnosis not present

## 2019-08-11 DIAGNOSIS — E6609 Other obesity due to excess calories: Secondary | ICD-10-CM | POA: Diagnosis not present

## 2019-08-11 DIAGNOSIS — Z6835 Body mass index (BMI) 35.0-35.9, adult: Secondary | ICD-10-CM | POA: Diagnosis not present

## 2019-08-11 DIAGNOSIS — N1832 Chronic kidney disease, stage 3b: Secondary | ICD-10-CM | POA: Diagnosis not present

## 2019-08-11 DIAGNOSIS — R7301 Impaired fasting glucose: Secondary | ICD-10-CM | POA: Diagnosis not present

## 2019-09-23 DIAGNOSIS — H5212 Myopia, left eye: Secondary | ICD-10-CM | POA: Diagnosis not present

## 2019-09-23 DIAGNOSIS — H52223 Regular astigmatism, bilateral: Secondary | ICD-10-CM | POA: Diagnosis not present

## 2019-10-07 DIAGNOSIS — R69 Illness, unspecified: Secondary | ICD-10-CM | POA: Diagnosis not present

## 2019-10-07 DIAGNOSIS — M79606 Pain in leg, unspecified: Secondary | ICD-10-CM | POA: Diagnosis not present

## 2019-10-07 DIAGNOSIS — M2550 Pain in unspecified joint: Secondary | ICD-10-CM | POA: Diagnosis not present

## 2019-11-30 ENCOUNTER — Other Ambulatory Visit (HOSPITAL_COMMUNITY): Payer: Self-pay | Admitting: Internal Medicine

## 2019-11-30 ENCOUNTER — Other Ambulatory Visit: Payer: Self-pay

## 2019-11-30 ENCOUNTER — Ambulatory Visit (HOSPITAL_COMMUNITY)
Admission: RE | Admit: 2019-11-30 | Discharge: 2019-11-30 | Disposition: A | Discharge status: Home / Self Care | Payer: Medicare HMO | Source: Ambulatory Visit | Attending: Internal Medicine | Admitting: Internal Medicine

## 2019-11-30 DIAGNOSIS — R21 Rash and other nonspecific skin eruption: Secondary | ICD-10-CM | POA: Diagnosis not present

## 2019-11-30 DIAGNOSIS — E039 Hypothyroidism, unspecified: Secondary | ICD-10-CM | POA: Diagnosis not present

## 2019-11-30 DIAGNOSIS — M25562 Pain in left knee: Secondary | ICD-10-CM

## 2019-11-30 DIAGNOSIS — M1712 Unilateral primary osteoarthritis, left knee: Secondary | ICD-10-CM | POA: Diagnosis not present

## 2019-11-30 DIAGNOSIS — M25462 Effusion, left knee: Secondary | ICD-10-CM | POA: Diagnosis not present

## 2019-12-02 DIAGNOSIS — E782 Mixed hyperlipidemia: Secondary | ICD-10-CM | POA: Diagnosis not present

## 2019-12-02 DIAGNOSIS — M179 Osteoarthritis of knee, unspecified: Secondary | ICD-10-CM | POA: Diagnosis not present

## 2019-12-02 DIAGNOSIS — M25562 Pain in left knee: Secondary | ICD-10-CM | POA: Diagnosis not present

## 2019-12-16 ENCOUNTER — Encounter: Payer: Self-pay | Admitting: Radiology

## 2019-12-22 ENCOUNTER — Encounter: Payer: Self-pay | Admitting: Orthopedic Surgery

## 2020-01-02 ENCOUNTER — Other Ambulatory Visit: Payer: Self-pay

## 2020-01-02 DIAGNOSIS — M79605 Pain in left leg: Secondary | ICD-10-CM | POA: Insufficient documentation

## 2020-01-02 DIAGNOSIS — M79604 Pain in right leg: Secondary | ICD-10-CM | POA: Insufficient documentation

## 2020-01-02 DIAGNOSIS — I10 Essential (primary) hypertension: Secondary | ICD-10-CM | POA: Diagnosis not present

## 2020-01-02 DIAGNOSIS — Z96651 Presence of right artificial knee joint: Secondary | ICD-10-CM | POA: Insufficient documentation

## 2020-01-02 DIAGNOSIS — L500 Allergic urticaria: Secondary | ICD-10-CM | POA: Diagnosis not present

## 2020-01-02 DIAGNOSIS — Z79899 Other long term (current) drug therapy: Secondary | ICD-10-CM | POA: Insufficient documentation

## 2020-01-02 DIAGNOSIS — T7840XA Allergy, unspecified, initial encounter: Secondary | ICD-10-CM | POA: Diagnosis present

## 2020-01-02 DIAGNOSIS — L509 Urticaria, unspecified: Secondary | ICD-10-CM | POA: Diagnosis not present

## 2020-01-02 DIAGNOSIS — R252 Cramp and spasm: Secondary | ICD-10-CM | POA: Diagnosis not present

## 2020-01-03 ENCOUNTER — Emergency Department (HOSPITAL_COMMUNITY)
Admission: EM | Admit: 2020-01-03 | Discharge: 2020-01-03 | Disposition: A | Discharge status: Home / Self Care | Payer: Medicare HMO | Attending: Emergency Medicine | Admitting: Emergency Medicine

## 2020-01-03 ENCOUNTER — Encounter (HOSPITAL_COMMUNITY): Payer: Self-pay | Admitting: Emergency Medicine

## 2020-01-03 DIAGNOSIS — R252 Cramp and spasm: Secondary | ICD-10-CM

## 2020-01-03 DIAGNOSIS — L509 Urticaria, unspecified: Secondary | ICD-10-CM

## 2020-01-03 MED ORDER — PREDNISONE 50 MG PO TABS: 50.0000 mg | ORAL_TABLET | Freq: Every day | ORAL | 0 refills | Status: AC

## 2020-01-03 MED ORDER — FAMOTIDINE IN NACL 20-0.9 MG/50ML-% IV SOLN
20.0000 mg | Freq: Once | INTRAVENOUS | Status: AC
  Administered 2020-01-03: 20 mg via INTRAVENOUS
  Filled 2020-01-03: qty 50

## 2020-01-03 MED ORDER — DOXEPIN HCL 10 MG PO CAPS
10.0000 mg | ORAL_CAPSULE | Freq: Once | ORAL | Status: AC
  Administered 2020-01-03: 10 mg via ORAL
  Filled 2020-01-03 (×2): qty 1

## 2020-01-03 MED ORDER — DOXEPIN HCL 10 MG PO CAPS: 10.0000 mg | ORAL_CAPSULE | Freq: Three times a day (TID) | ORAL | 0 refills | Status: AC

## 2020-01-03 MED ORDER — METHYLPREDNISOLONE SODIUM SUCC 125 MG IJ SOLR
125.0000 mg | Freq: Once | INTRAMUSCULAR | Status: AC
  Administered 2020-01-03: 125 mg via INTRAVENOUS
  Filled 2020-01-03: qty 2

## 2020-01-03 NOTE — Discharge Instructions (Addendum)
Take cetirizine (Zyrtec) once a day for the next week, then as needed.  If hives come back, you may take diphenhydramine (Benadryl).  Return if you are having any problems.

## 2020-01-03 NOTE — ED Triage Notes (Signed)
Pt states she had just finished having company over when she started itching "all over". Pt then noticed hives to thighs, stomach,and bilateral arms. Pt took 75mg  Benadryl at home and then came to ED. Pt denies any new foods, meds, or cleaning products. Pt anxious on arrival and stating she feels like she is going to pass out. Pt became pale and then stated she was going to vomit. Emesis bag provided and pt proceeded to vomit.

## 2020-01-03 NOTE — ED Provider Notes (Signed)
[facility] EMERGENCY DEPARTMENT Provider Note   CSN: [id number] Arrival date & time: 01/02/20  2336   History Chief Complaint  Patient presents with  . Allergic Reaction    Kristina Mcpherson is a 69 y.o. female.  The history is provided by the patient.  Allergic Reaction She has history of hypertension but not on any medications comes in after breaking out in hives.  She had eaten some hamburger and then ate some black walnut spice cake.  Shortly after that, she broke out in hives across her arms, trunk, legs.  There was generalized itching but no difficulty breathing or swallowing.  She did take diphenhydramine 75 mg at home and then came into the ED.  She has never had problems with hives before.  Past Medical History:  Diagnosis Date  . History of hiatal hernia   . Hypertension     Patient Active Problem List   Diagnosis Date Noted  . Special screening for malignant neoplasms, colon     Past Surgical History:  Procedure Laterality Date  . ABDOMINAL HYSTERECTOMY    . ADENOIDECTOMY    . CATARACT EXTRACTION W/PHACO Left 05/09/2015   Procedure: CATARACT EXTRACTION PHACO AND INTRAOCULAR LENS PLACEMENT (IOC);  Surgeon: Jethro Bolus, MD;  Location: [facility];  Service: Ophthalmology;  Laterality: Left;  CDE:4.83  . CATARACT EXTRACTION W/PHACO Right 05/23/2015   Procedure: CATARACT EXTRACTION PHACO AND INTRAOCULAR LENS PLACEMENT (IOC);  Surgeon: Jethro Bolus, MD;  Location: [facility];  Service: Ophthalmology;  Laterality: Right;  CDE:7.81  . COLONOSCOPY N/A 06/18/2017   Procedure: COLONOSCOPY;  Surgeon: West Bali, MD;  Location: [facility];  Service: Endoscopy;  Laterality: N/A;  2:00  . KNEE ARTHROSCOPY Right   . shaving bone foot Right    5th toe  . TOTAL KNEE ARTHROPLASTY Right      OB History   No obstetric history on file.     Family History  Problem Relation Age of Onset  . Leukemia Mother   . Coronary artery disease Mother   . Hypertension Mother   .  Hypercholesterolemia Mother   . Dementia Mother   . Diabetes Mother   . Hypertension Father   . Hypercholesterolemia Father   . Transient ischemic attack Father   . Coronary artery disease Brother     Social History   Tobacco Use  . Smoking status: Never Smoker  . Smokeless tobacco: Never Used  Vaping Use  . Vaping Use: Never used  Substance Use Topics  . Alcohol use: Yes    Comment: rare  . Drug use: No    Home Medications Prior to Admission medications   Medication Sig Start Date End Date Taking? Authorizing Provider  Alpha-Lipoic Acid 300 MG CAPS Take 1 capsule by mouth daily.    [provider]  Biotin 5 MG TABS Take 1 tablet by mouth daily.    [provider]  Borage, Borago officinalis, (BORAGE OIL PO) Take 600 mg by mouth daily.    [provider]  Calcium Carbonate-Vitamin D (CALTRATE 600+D PO) Take 1 tablet by mouth daily.    [provider]  CINNAMON PO Take 600 mg by mouth daily.    [provider]  Janett Billow, Camillia sinensis, (GREEN TEA PO) Take 500 mg by mouth daily.    [provider]  losartan-hydrochlorothiazide (HYZAAR) 100-12.5 MG tablet Take 0.5 tablets by mouth 2 (two) times daily.    [provider]  MILK THISTLE PO Take 1  capsule by mouth daily. 80%    [provider]  Multiple Vitamins-Minerals (MULTIVITAMINS THER. W/MINERALS) TABS tablet Take 1 tablet by mouth daily.    [provider]  multivitamin-lutein (OCUVITE-LUTEIN) CAPS capsule Take 1 capsule by mouth daily.    [provider]  Probiotic Product (PROBIOTIC DAILY PO) Take 1 capsule by mouth daily.    [provider]    Allergies    Codeine  Review of Systems   Review of Systems  All other systems reviewed and are negative.   Physical Exam Updated Vital Signs BP 115/60   Pulse (!) 115   Temp 98 F (36.7 C) (Oral)   Resp (!) 36   Ht 5\' 4"  (1.626 m)   Wt 96.2 kg   SpO2 95%   BMI 36.39  kg/m   Physical Exam Vitals and nursing note reviewed.   69 year old female, resting comfortably and in no acute distress. Vital signs are significant for slightly elevated heart rate and respiratory rate. Oxygen saturation is 95%, which is normal. Head is normocephalic and atraumatic. PERRLA, EOMI. Oropharynx is clear.  No intraoral lesions or swelling of the sublingual tissue.  No edema of the uvula.  No pooling of secretions and no stridor. Neck is nontender and supple without adenopathy or JVD. Back is nontender and there is no CVA tenderness. Lungs are clear without rales, wheezes, or rhonchi. Chest is nontender. Heart has regular rate and rhythm without murmur. Abdomen is soft, flat, nontender without masses or hepatosplenomegaly and peristalsis is normoactive. Extremities have no cyanosis or edema, full range of motion is present. Skin is warm and dry without rash. Neurologic: Mental status is normal, cranial nerves are intact, there are no motor or sensory deficits.  ED Results / Procedures / Treatments    Procedures Procedures   Medications Ordered in ED Medications  methylPREDNISolone sodium succinate (SOLU-MEDROL) 125 mg/2 mL injection 125 mg (125 mg Intravenous Given 01/03/20 0050)  famotidine (PEPCID) IVPB 20 mg premix (20 mg Intravenous New Bag/Given 01/03/20 0051)    ED Course  I have reviewed the triage vital signs and the nursing notes.  MDM Rules/Calculators/A&P Urticaria which has resolved.  She did have some hives present on arrival and was given methylprednisolone and famotidine with complete resolution by the time I saw her.  Old records are reviewed, and she has no relevant past visits.  She will be discharged with prescription for prednisone and doxepin, told to use over-the-counter cetirizine.  While in the ED, she did develop significant leg cramps.  She is not on any diuretics, so electrolytes were not checked.  Final Clinical Impression(s) / ED  Diagnoses Final diagnoses:  Urticaria  Leg cramps    Rx / DC Orders ED Discharge Orders         Ordered    predniSONE (DELTASONE) 50 MG tablet  Daily        01/03/20 0222    doxepin (SINEQUAN) 10 MG capsule  3 times daily        01/03/20 03/04/20, MD 01/03/20 [phone]

## 2020-01-12 DIAGNOSIS — I10 Essential (primary) hypertension: Secondary | ICD-10-CM | POA: Diagnosis not present

## 2020-01-12 DIAGNOSIS — R419 Unspecified symptoms and signs involving cognitive functions and awareness: Secondary | ICD-10-CM | POA: Diagnosis not present

## 2020-01-12 DIAGNOSIS — M25551 Pain in right hip: Secondary | ICD-10-CM | POA: Diagnosis not present

## 2020-02-02 DIAGNOSIS — M791 Myalgia, unspecified site: Secondary | ICD-10-CM | POA: Diagnosis not present

## 2020-02-02 DIAGNOSIS — Z6832 Body mass index (BMI) 32.0-32.9, adult: Secondary | ICD-10-CM | POA: Diagnosis not present

## 2020-02-02 DIAGNOSIS — H6503 Acute serous otitis media, bilateral: Secondary | ICD-10-CM | POA: Diagnosis not present

## 2020-02-02 DIAGNOSIS — E6609 Other obesity due to excess calories: Secondary | ICD-10-CM | POA: Diagnosis not present

## 2020-02-02 DIAGNOSIS — E875 Hyperkalemia: Secondary | ICD-10-CM | POA: Diagnosis not present

## 2020-02-02 DIAGNOSIS — J111 Influenza due to unidentified influenza virus with other respiratory manifestations: Secondary | ICD-10-CM | POA: Diagnosis not present

## 2020-02-02 DIAGNOSIS — N183 Chronic kidney disease, stage 3 unspecified: Secondary | ICD-10-CM | POA: Diagnosis not present

## 2020-02-02 DIAGNOSIS — H6121 Impacted cerumen, right ear: Secondary | ICD-10-CM | POA: Diagnosis not present

## 2020-02-02 DIAGNOSIS — R7301 Impaired fasting glucose: Secondary | ICD-10-CM | POA: Diagnosis not present

## 2020-02-02 DIAGNOSIS — R110 Nausea: Secondary | ICD-10-CM | POA: Diagnosis not present

## 2020-02-02 DIAGNOSIS — R42 Dizziness and giddiness: Secondary | ICD-10-CM | POA: Diagnosis not present

## 2020-02-02 DIAGNOSIS — S0101XA Laceration without foreign body of scalp, initial encounter: Secondary | ICD-10-CM | POA: Diagnosis not present

## 2020-02-07 DIAGNOSIS — E6609 Other obesity due to excess calories: Secondary | ICD-10-CM | POA: Diagnosis not present

## 2020-02-07 DIAGNOSIS — Z6835 Body mass index (BMI) 35.0-35.9, adult: Secondary | ICD-10-CM | POA: Diagnosis not present

## 2020-02-07 DIAGNOSIS — R7301 Impaired fasting glucose: Secondary | ICD-10-CM | POA: Diagnosis not present

## 2020-02-07 DIAGNOSIS — N1832 Chronic kidney disease, stage 3b: Secondary | ICD-10-CM | POA: Diagnosis not present

## 2020-02-07 DIAGNOSIS — E875 Hyperkalemia: Secondary | ICD-10-CM | POA: Diagnosis not present

## 2020-02-07 DIAGNOSIS — R002 Palpitations: Secondary | ICD-10-CM | POA: Diagnosis not present

## 2020-02-07 DIAGNOSIS — R69 Illness, unspecified: Secondary | ICD-10-CM | POA: Diagnosis not present

## 2020-02-07 DIAGNOSIS — E1169 Type 2 diabetes mellitus with other specified complication: Secondary | ICD-10-CM | POA: Diagnosis not present

## 2020-03-02 DIAGNOSIS — R69 Illness, unspecified: Secondary | ICD-10-CM | POA: Diagnosis not present

## 2020-03-02 DIAGNOSIS — Z23 Encounter for immunization: Secondary | ICD-10-CM | POA: Diagnosis not present

## 2020-03-20 DIAGNOSIS — M1712 Unilateral primary osteoarthritis, left knee: Secondary | ICD-10-CM | POA: Diagnosis not present

## 2020-04-04 ENCOUNTER — Encounter: Payer: Self-pay | Admitting: Neurology

## 2020-04-04 ENCOUNTER — Telehealth: Payer: Self-pay | Admitting: Neurology

## 2020-04-04 ENCOUNTER — Ambulatory Visit (INDEPENDENT_AMBULATORY_CARE_PROVIDER_SITE_OTHER): Payer: Medicare HMO | Admitting: Neurology

## 2020-04-04 VITALS — BP 153/85 | HR 77 | Ht 65.0 in | Wt 208.0 lb

## 2020-04-04 DIAGNOSIS — R4189 Other symptoms and signs involving cognitive functions and awareness: Secondary | ICD-10-CM

## 2020-04-04 MED ORDER — MEMANTINE HCL 10 MG PO TABS: 10.0000 mg | ORAL_TABLET | Freq: Two times a day (BID) | ORAL | 4 refills | Status: DC

## 2020-04-04 MED ORDER — DONEPEZIL HCL 10 MG PO TABS: 10.0000 mg | ORAL_TABLET | Freq: Every day | ORAL | 4 refills | Status: DC

## 2020-04-04 NOTE — Telephone Encounter (Signed)
Get laboratory evaluation from her primary care physician

## 2020-04-04 NOTE — Progress Notes (Signed)
Chief Complaint  Patient presents with  . Dementia    New pt, husbandDorene Mcpherson   MMSE 17    HISTORICAL  Kristina Mcpherson is a 69 year old female, seen in request by her primary care nurse practitioner Dr. Nita Sells for evaluation of memory loss, she is accompanied by her husband of 4years at initial evaluation of 04/04/2020.  I reviewed and summarized the referring note.PMHx. 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  Around 2020, she was found to have gradual onset of memory loss, tends to repeat questions, misplace things, mild gait abnormality that attributed to her right knee pain,  Her mother suffered dementia in her 75s.  Maternal grandfather also suffered dementia  Reported normal laboratory evaluation by her primary care physician in November 2020 REVIEW OF SYSTEMS: Full 14 system review of systems performed and notable only for as above All other review of systems were negative.  ALLERGIES: Allergies  Allergen Reactions  . Codeine Nausea Only  . Percocet [Oxycodone-Acetaminophen] Nausea Only    HOME MEDICATIONS: Current Outpatient Medications  Medication Sig Dispense Refill  . donepezil (ARICEPT) 5 MG tablet Take 5 mg by mouth at bedtime.    . memantine (NAMENDA) 5 MG tablet Take 5 mg by mouth 2 (two) times daily.    . Alpha-Lipoic Acid 300 MG CAPS Take 1 capsule by mouth daily. (Patient not taking: Reported on 04/04/2020)    . Biotin 5 MG TABS Take 1 tablet by mouth daily. (Patient not taking: Reported on 04/04/2020)    . Borage, Borago officinalis, (BORAGE OIL PO) Take 600 mg by mouth  daily. (Patient not taking: Reported on 04/04/2020)    . Calcium Carbonate-Vitamin D (CALTRATE 600+D PO) Take 1 tablet by mouth daily. (Patient not taking: Reported on 04/04/2020)    . CINNAMON PO Take 600 mg by mouth daily. (Patient not taking: Reported on 04/04/2020)    . doxepin (SINEQUAN) 10 MG capsule Take 1 capsule (10 mg total) by mouth 3 (three) times daily. (Patient not taking: Reported on 04/04/2020) 21 capsule 0  . Green Tea, Camillia sinensis, (GREEN TEA PO) Take 500 mg by mouth daily. (Patient not taking: Reported on 04/04/2020)    . losartan-hydrochlorothiazide (HYZAAR) 100-12.5 MG tablet Take 0.5 tablets by mouth 2 (two) times daily. (Patient not taking: Reported on 04/04/2020)    . MILK THISTLE PO Take 1 capsule by mouth daily. 80% (Patient not taking: Reported on 04/04/2020)    . Multiple Vitamins-Minerals (MULTIVITAMINS THER. W/MINERALS) TABS tablet Take 1 tablet by mouth daily. (Patient not taking: Reported on 04/04/2020)    . multivitamin-lutein (OCUVITE-LUTEIN) CAPS capsule Take 1 capsule by mouth daily. (Patient not taking: Reported on 04/04/2020)    . predniSONE (DELTASONE) 50 MG tablet Take 1 tablet (50 mg total) by mouth daily. (Patient not taking: Reported on 04/04/2020) 5 tablet 0  . Probiotic Product (PROBIOTIC DAILY PO) Take 1 capsule by mouth daily. (Patient not taking: Reported on 04/04/2020)     No current facility-administered medications for this visit.    PAST MEDICAL HISTORY: Past Medical History:  Diagnosis Date  . History of hiatal hernia   . Hypertension     PAST SURGICAL HISTORY: Past Surgical History:  Procedure Laterality Date  . ABDOMINAL HYSTERECTOMY    . ADENOIDECTOMY    . CATARACT EXTRACTION W/PHACO Left 05/09/2015   Procedure: CATARACT EXTRACTION PHACO AND INTRAOCULAR LENS PLACEMENT (IOC);  Surgeon: Jethro Bolus, MD;  Location: [facility];  Service: Ophthalmology;  Laterality: Left;  CDE:4.83  . CATARACT EXTRACTION W/PHACO Right 05/23/2015   Procedure:  CATARACT EXTRACTION PHACO AND INTRAOCULAR LENS PLACEMENT (IOC);  Surgeon: Jethro Bolus, MD;  Location: [facility];  Service: Ophthalmology;  Laterality: Right;  CDE:7.81  . COLONOSCOPY N/A 06/18/2017   Procedure: COLONOSCOPY;  Surgeon: West Bali, MD;  Location: [facility];  Service: Endoscopy;  Laterality: N/A;  2:00  . KIDNEY DONATION    . KNEE ARTHROSCOPY Right   . shaving bone foot Right    5th toe  . TOTAL KNEE ARTHROPLASTY Right     FAMILY HISTORY: Family History  Problem Relation Age of Onset  . Leukemia Mother   . Coronary artery disease Mother   . Hypertension Mother   . Hypercholesterolemia Mother   . Dementia Mother   . Diabetes Mother   . Hypertension Father   . Hypercholesterolemia Father   . Transient ischemic attack Father   . Coronary artery disease Brother   . Dementia Maternal Grandfather     SOCIAL HISTORY: Social History   Socioeconomic History  . Marital status: Married    Spouse name: Not on file  . Number of children: 2  . Years of education: Not on file  . Highest education level: Some college, no degree  Occupational History    Comment: retired OR Charity fundraiser  Tobacco Use  . Smoking status: Never Smoker  . Smokeless tobacco: Never Used  Vaping Use  . Vaping Use: Never used  Substance and Sexual Activity  . Alcohol use: Not Currently    Comment: rare  . Drug use: No  . Sexual activity: Never    Birth control/protection: Surgical  Other Topics Concern  . Not on file  Social History Narrative   04/04/20 lives with husband   Caffeine coffee 1 a day   Social Determinants of Health   Financial Resource Strain: Not on file  Food Insecurity: Not on file  Transportation Needs: Not on file  Physical Activity: Not on file  Stress: Not on file  Social Connections: Not on file  Intimate Partner Violence: Not on file     PHYSICAL EXAM   Vitals:   04/04/20 0855  BP: (!) 153/85  Pulse: 77  Weight: 208 lb (94.3 kg)  Height: 5\' 5"  (1.651 m)    Not recorded     Body mass index is 34.61 kg/m.  PHYSICAL EXAMNIATION:  Gen: NAD, conversant, well nourised, well groomed                     Cardiovascular: Regular rate rhythm, no peripheral edema, warm, nontender. Eyes: Conjunctivae clear without exudates or hemorrhage Neck: Supple, no carotid bruits. Pulmonary: Clear to auscultation bilaterally   NEUROLOGICAL EXAM:  MMSE - Mini Mental State Exam 04/04/2020  Orientation to time 1  Orientation to Place 5  Registration 3  Attention/ Calculation 0  Recall 1  Language- name 2 objects 2  Language- repeat 0  Language- follow 3 step command 3  Language- read & follow direction 1  Write a sentence 1  Copy design 0  Total score 17  animal naming 9.   CRANIAL NERVES: CN II: Visual fields are full to confrontation. Pupils are round equal and briskly reactive to light. CN III, IV, VI: extraocular movement are normal. No ptosis. CN V: Facial sensation is intact to light touch CN VII: Face is symmetric with normal eye closure  CN VIII: Hearing is normal to causal conversation. CN IX, X: Phonation is normal. CN XI: Head turning and shoulder shrug are intact  MOTOR: There is no pronator drift of out-stretched arms. Muscle bulk and tone are normal. Muscle strength is normal.  REFLEXES: Reflexes are 1 and symmetric at the biceps, triceps, knees, and ankles. Plantar responses are flexor.  SENSORY: Intact to light touch, pinprick and vibratory sensation are intact in fingers and toes.  COORDINATION: There is no trunk or limb dysmetria noted.  GAIT/STANCE: Need push-up to get up from seated position, antalgic, Romberg is absent.   DIAGNOSTIC DATA (LABS, IMAGING, TESTING) - I reviewed patient records, labs, notes, testing and imaging myself where available.   ASSESSMENT AND PLAN  AILEEN AMORE is a 70 y.o. 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 Ph.D.  [facility] [address], [address] [place], [place] [address] Ph: [phone] Fax: [phone]  CC:  Roe Rutherford, NP No address on file  Benita Stabile, MD

## 2020-04-04 NOTE — Telephone Encounter (Signed)
I left  voice mail pt PCP office  is close only seeing pt virtual appt only. I left our fax number and my phone.

## 2020-04-04 NOTE — Telephone Encounter (Signed)
[employer] medicare order sent to [facility]. They will obtain the auth and reach out to the patient to schedule.  °

## 2020-05-02 ENCOUNTER — Ambulatory Visit
Admission: RE | Admit: 2020-05-02 | Discharge: 2020-05-02 | Disposition: A | Discharge status: Home / Self Care | Payer: Medicare HMO | Source: Ambulatory Visit | Attending: Neurology | Admitting: Neurology

## 2020-05-02 DIAGNOSIS — R4189 Other symptoms and signs involving cognitive functions and awareness: Secondary | ICD-10-CM

## 2020-05-04 ENCOUNTER — Telehealth: Payer: Self-pay | Admitting: Neurology

## 2020-05-04 NOTE — Telephone Encounter (Signed)
  IMPRESSION:   MRI brain (without) demonstrating: - Mild chronic small vessel ischemic disease. - Moderate mesial temporal atrophy and mild ventriculomegaly on ex vacuo basis.  - No acute findings.  Please call patient, MRI of the brain showed no acute abnormality, evidence of generalized atrophy, supratentorium small vessel disease, I will review MRI findings with her at next followup

## 2020-05-04 NOTE — Telephone Encounter (Signed)
I spoke to the patient and she verbalized understanding of the findings. 

## 2020-05-11 DIAGNOSIS — M1712 Unilateral primary osteoarthritis, left knee: Secondary | ICD-10-CM | POA: Diagnosis not present

## 2020-05-18 DIAGNOSIS — M1712 Unilateral primary osteoarthritis, left knee: Secondary | ICD-10-CM | POA: Diagnosis not present

## 2020-05-25 DIAGNOSIS — M1712 Unilateral primary osteoarthritis, left knee: Secondary | ICD-10-CM | POA: Diagnosis not present

## 2020-06-01 ENCOUNTER — Other Ambulatory Visit: Payer: Self-pay | Admitting: Internal Medicine

## 2020-06-01 DIAGNOSIS — Z1231 Encounter for screening mammogram for malignant neoplasm of breast: Secondary | ICD-10-CM

## 2020-07-27 ENCOUNTER — Ambulatory Visit: Payer: Medicare HMO

## 2020-08-01 DIAGNOSIS — Z23 Encounter for immunization: Secondary | ICD-10-CM | POA: Diagnosis not present

## 2020-09-07 ENCOUNTER — Ambulatory Visit: Payer: Medicare HMO

## 2020-10-03 ENCOUNTER — Telehealth: Payer: Self-pay | Admitting: *Deleted

## 2020-10-03 ENCOUNTER — Encounter: Payer: Self-pay | Admitting: Neurology

## 2020-10-03 ENCOUNTER — Ambulatory Visit: Payer: Medicare HMO | Admitting: Neurology

## 2020-10-03 NOTE — Telephone Encounter (Signed)
No showed follow up appointment. 

## 2020-10-04 ENCOUNTER — Other Ambulatory Visit: Payer: Self-pay

## 2020-10-04 ENCOUNTER — Ambulatory Visit
Admission: RE | Admit: 2020-10-04 | Discharge: 2020-10-04 | Disposition: A | Discharge status: Home / Self Care | Payer: Medicare HMO | Source: Ambulatory Visit | Attending: Internal Medicine | Admitting: Internal Medicine

## 2020-10-04 DIAGNOSIS — Z1231 Encounter for screening mammogram for malignant neoplasm of breast: Secondary | ICD-10-CM

## 2020-11-09 DIAGNOSIS — Z008 Encounter for other general examination: Secondary | ICD-10-CM | POA: Diagnosis not present

## 2020-11-09 DIAGNOSIS — Z0000 Encounter for general adult medical examination without abnormal findings: Secondary | ICD-10-CM | POA: Diagnosis not present

## 2020-11-24 DIAGNOSIS — E875 Hyperkalemia: Secondary | ICD-10-CM | POA: Diagnosis not present

## 2020-11-24 DIAGNOSIS — R69 Illness, unspecified: Secondary | ICD-10-CM | POA: Diagnosis not present

## 2020-11-24 DIAGNOSIS — E1165 Type 2 diabetes mellitus with hyperglycemia: Secondary | ICD-10-CM | POA: Diagnosis not present

## 2020-11-24 DIAGNOSIS — Z0001 Encounter for general adult medical examination with abnormal findings: Secondary | ICD-10-CM | POA: Diagnosis not present

## 2020-11-24 DIAGNOSIS — N1832 Chronic kidney disease, stage 3b: Secondary | ICD-10-CM | POA: Diagnosis not present

## 2021-03-07 DIAGNOSIS — J069 Acute upper respiratory infection, unspecified: Secondary | ICD-10-CM | POA: Diagnosis not present

## 2021-03-07 DIAGNOSIS — R059 Cough, unspecified: Secondary | ICD-10-CM | POA: Diagnosis not present

## 2021-03-07 DIAGNOSIS — J029 Acute pharyngitis, unspecified: Secondary | ICD-10-CM | POA: Diagnosis not present

## 2021-03-07 DIAGNOSIS — R519 Headache, unspecified: Secondary | ICD-10-CM | POA: Diagnosis not present

## 2021-04-06 ENCOUNTER — Other Ambulatory Visit: Payer: Self-pay | Admitting: Neurology

## 2021-04-20 ENCOUNTER — Other Ambulatory Visit: Payer: Self-pay | Admitting: Neurology

## 2021-05-24 DIAGNOSIS — E1165 Type 2 diabetes mellitus with hyperglycemia: Secondary | ICD-10-CM | POA: Diagnosis not present

## 2021-05-24 DIAGNOSIS — Z0001 Encounter for general adult medical examination with abnormal findings: Secondary | ICD-10-CM | POA: Diagnosis not present

## 2021-05-29 DIAGNOSIS — R7401 Elevation of levels of liver transaminase levels: Secondary | ICD-10-CM | POA: Diagnosis not present

## 2021-05-29 DIAGNOSIS — R69 Illness, unspecified: Secondary | ICD-10-CM | POA: Diagnosis not present

## 2021-05-29 DIAGNOSIS — N1832 Chronic kidney disease, stage 3b: Secondary | ICD-10-CM | POA: Diagnosis not present

## 2021-05-29 DIAGNOSIS — E1165 Type 2 diabetes mellitus with hyperglycemia: Secondary | ICD-10-CM | POA: Diagnosis not present

## 2021-05-29 DIAGNOSIS — E875 Hyperkalemia: Secondary | ICD-10-CM | POA: Diagnosis not present

## 2021-05-29 DIAGNOSIS — Z0001 Encounter for general adult medical examination with abnormal findings: Secondary | ICD-10-CM | POA: Diagnosis not present

## 2021-07-02 ENCOUNTER — Other Ambulatory Visit: Payer: Self-pay | Admitting: Neurology

## 2021-08-29 DIAGNOSIS — E1165 Type 2 diabetes mellitus with hyperglycemia: Secondary | ICD-10-CM | POA: Diagnosis not present

## 2021-08-29 DIAGNOSIS — N1832 Chronic kidney disease, stage 3b: Secondary | ICD-10-CM | POA: Diagnosis not present

## 2021-08-29 DIAGNOSIS — E875 Hyperkalemia: Secondary | ICD-10-CM | POA: Diagnosis not present

## 2021-09-04 ENCOUNTER — Other Ambulatory Visit: Payer: Self-pay | Admitting: Family Medicine

## 2021-09-04 ENCOUNTER — Other Ambulatory Visit (HOSPITAL_COMMUNITY): Payer: Self-pay | Admitting: Family Medicine

## 2021-09-04 DIAGNOSIS — R7401 Elevation of levels of liver transaminase levels: Secondary | ICD-10-CM

## 2021-09-04 DIAGNOSIS — E875 Hyperkalemia: Secondary | ICD-10-CM | POA: Diagnosis not present

## 2021-09-04 DIAGNOSIS — R69 Illness, unspecified: Secondary | ICD-10-CM | POA: Diagnosis not present

## 2021-09-04 DIAGNOSIS — R0981 Nasal congestion: Secondary | ICD-10-CM | POA: Diagnosis not present

## 2021-09-04 DIAGNOSIS — N1832 Chronic kidney disease, stage 3b: Secondary | ICD-10-CM | POA: Diagnosis not present

## 2021-09-04 DIAGNOSIS — Z6832 Body mass index (BMI) 32.0-32.9, adult: Secondary | ICD-10-CM | POA: Diagnosis not present

## 2021-09-04 DIAGNOSIS — E1165 Type 2 diabetes mellitus with hyperglycemia: Secondary | ICD-10-CM | POA: Diagnosis not present

## 2021-09-13 ENCOUNTER — Ambulatory Visit (HOSPITAL_COMMUNITY)
Admission: RE | Admit: 2021-09-13 | Discharge: 2021-09-13 | Disposition: A | Discharge status: Home / Self Care | Payer: Medicare HMO | Source: Ambulatory Visit | Attending: Family Medicine | Admitting: Family Medicine

## 2021-09-13 DIAGNOSIS — R7401 Elevation of levels of liver transaminase levels: Secondary | ICD-10-CM | POA: Diagnosis not present

## 2021-09-13 DIAGNOSIS — K8020 Calculus of gallbladder without cholecystitis without obstruction: Secondary | ICD-10-CM | POA: Diagnosis not present

## 2021-12-06 DIAGNOSIS — E1165 Type 2 diabetes mellitus with hyperglycemia: Secondary | ICD-10-CM | POA: Diagnosis not present

## 2021-12-06 DIAGNOSIS — N1832 Chronic kidney disease, stage 3b: Secondary | ICD-10-CM | POA: Diagnosis not present

## 2021-12-13 DIAGNOSIS — R69 Illness, unspecified: Secondary | ICD-10-CM | POA: Diagnosis not present

## 2021-12-13 DIAGNOSIS — E875 Hyperkalemia: Secondary | ICD-10-CM | POA: Diagnosis not present

## 2021-12-13 DIAGNOSIS — E1165 Type 2 diabetes mellitus with hyperglycemia: Secondary | ICD-10-CM | POA: Diagnosis not present

## 2021-12-13 DIAGNOSIS — R0981 Nasal congestion: Secondary | ICD-10-CM | POA: Diagnosis not present

## 2021-12-13 DIAGNOSIS — H00012 Hordeolum externum right lower eyelid: Secondary | ICD-10-CM | POA: Diagnosis not present

## 2021-12-13 DIAGNOSIS — Z6836 Body mass index (BMI) 36.0-36.9, adult: Secondary | ICD-10-CM | POA: Diagnosis not present

## 2021-12-13 DIAGNOSIS — K8020 Calculus of gallbladder without cholecystitis without obstruction: Secondary | ICD-10-CM | POA: Diagnosis not present

## 2021-12-13 DIAGNOSIS — R7401 Elevation of levels of liver transaminase levels: Secondary | ICD-10-CM | POA: Diagnosis not present

## 2021-12-13 DIAGNOSIS — N1832 Chronic kidney disease, stage 3b: Secondary | ICD-10-CM | POA: Diagnosis not present

## 2022-03-04 IMAGING — MG MM DIGITAL SCREENING BILAT W/ TOMO AND CAD
6 of 10 series · 6 of 30 positions shown · non-contrast
Comparison: Previous exam(s).

CLINICAL DATA: Screening.

EXAM:
DIGITAL SCREENING BILATERAL MAMMOGRAM WITH TOMOSYNTHESIS AND CAD
TECHNIQUE: Bilateral screening digital craniocaudal and mediolateral oblique
mammograms were obtained. Bilateral screening digital breast
tomosynthesis was performed. The images were evaluated with
computer-aided detection.

[L MLO synth-2D (1 of 2)]
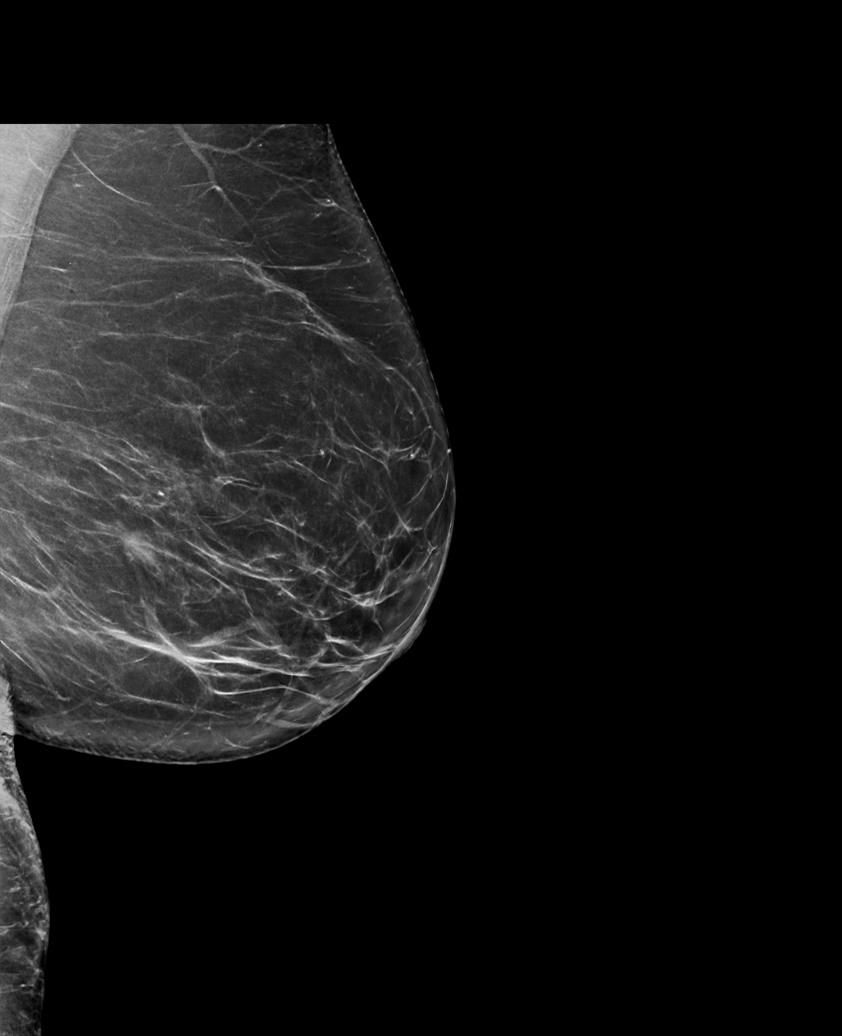

[L CC synth-2D]
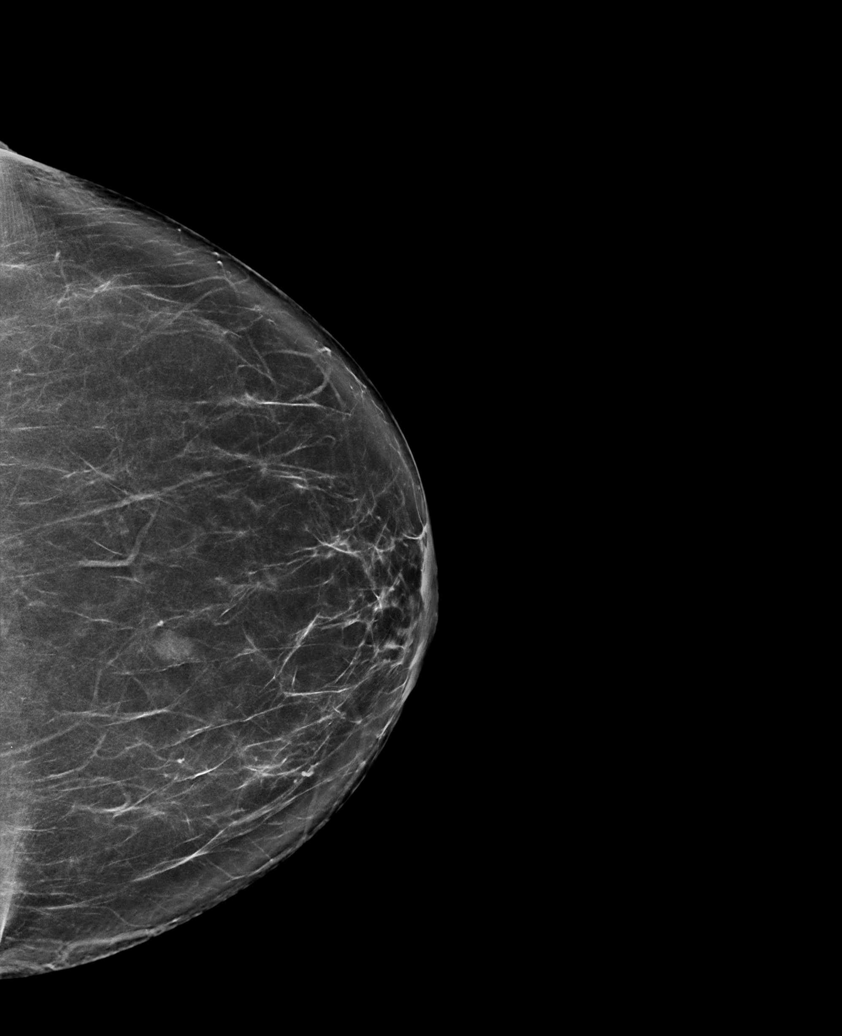

[R CC synth-2D]
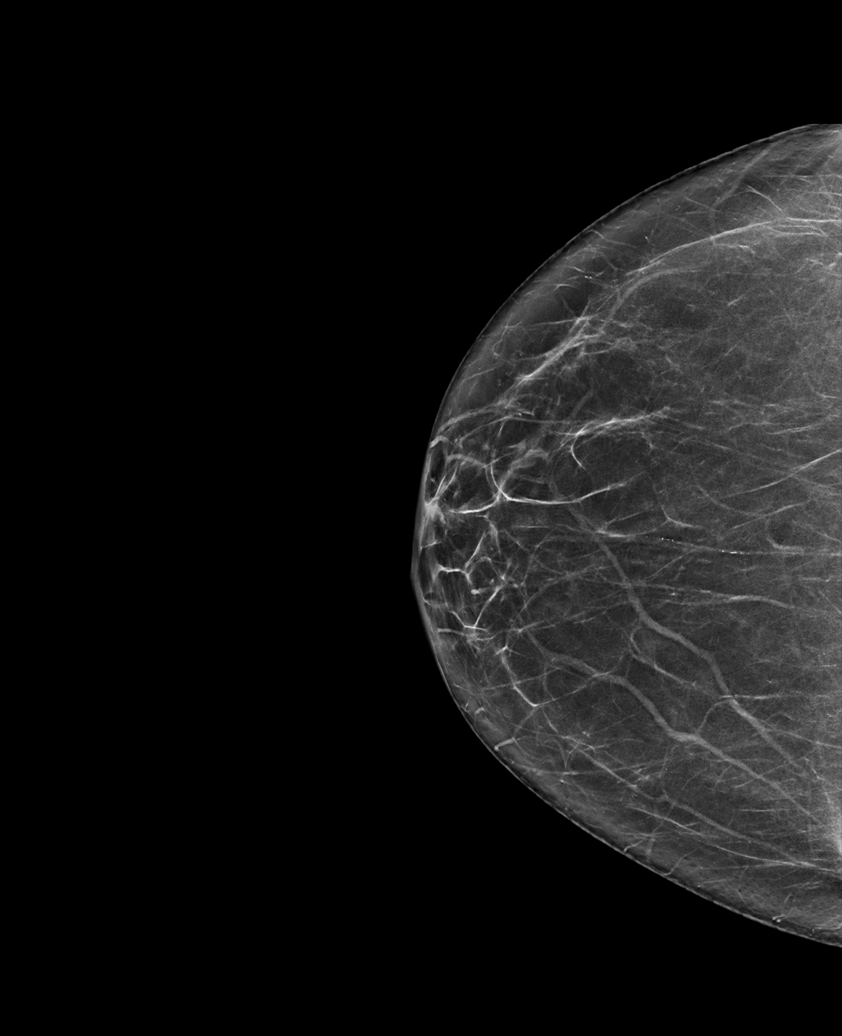

[L MLO synth-2D (2 of 2)]
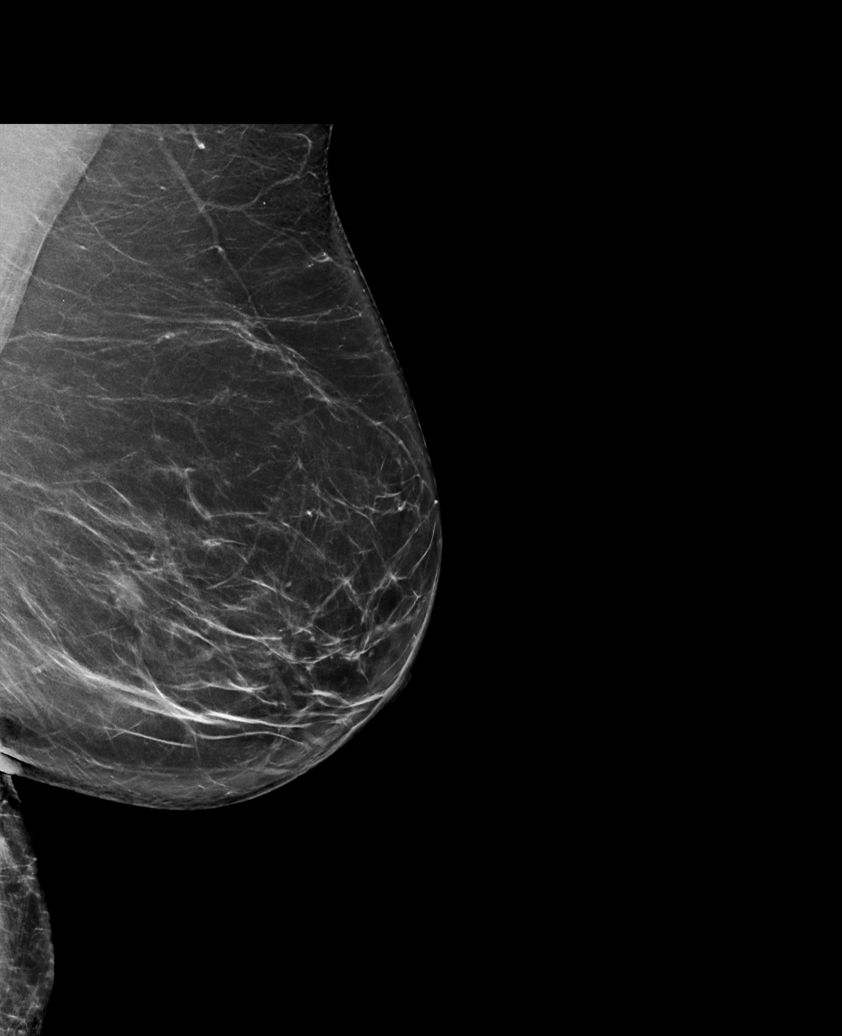

[R MLO synth-2D]
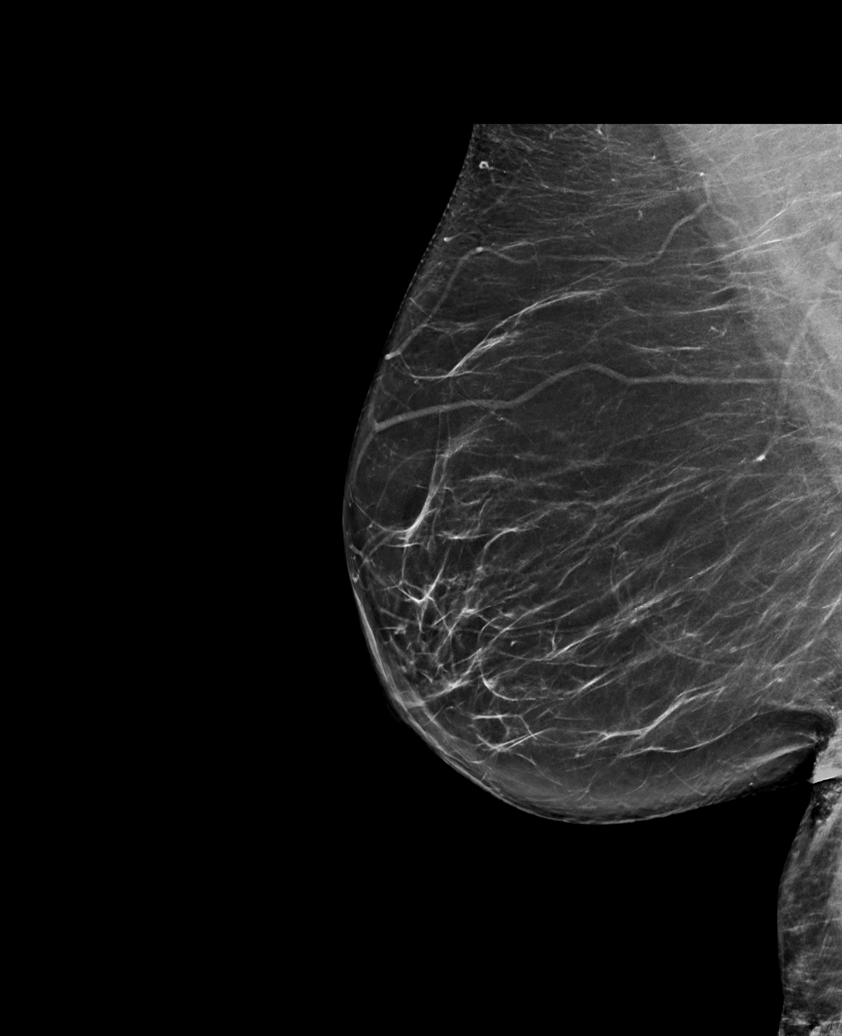

[L MLO tomo · tomo slice 43/84.0]
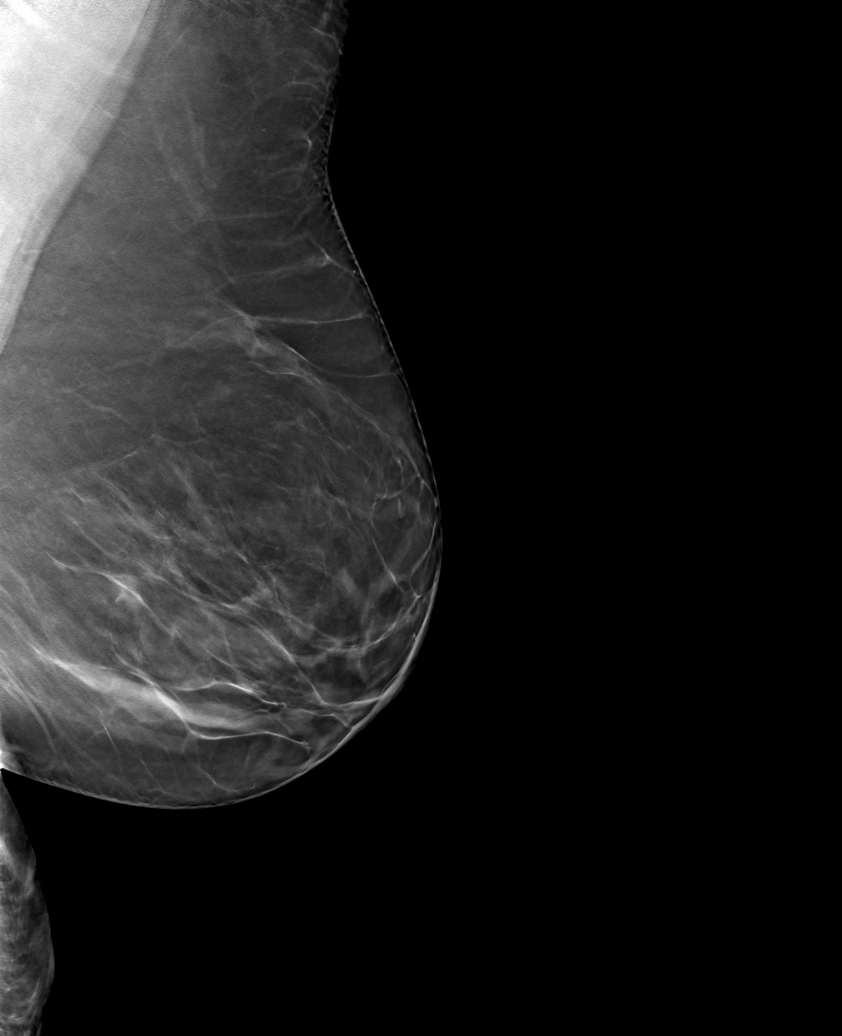

[6 of 30 positions shown; findings below may reference images not displayed]

ACR Breast Density Category b: There are scattered areas of
fibroglandular density.
FINDINGS: There are no findings suspicious for malignancy.
IMPRESSION: No mammographic evidence of malignancy. A result letter of this
screening mammogram will be mailed directly to the patient.

RECOMMENDATION:
Screening mammogram in one year. (Code:51-O-LD2)

BI-RADS CATEGORY  1: Negative.

## 2022-07-17 DIAGNOSIS — F02818 Dementia in other diseases classified elsewhere, unspecified severity, with other behavioral disturbance: Secondary | ICD-10-CM | POA: Diagnosis not present

## 2022-07-17 DIAGNOSIS — G309 Alzheimer's disease, unspecified: Secondary | ICD-10-CM | POA: Diagnosis not present

## 2022-07-17 DIAGNOSIS — K8020 Calculus of gallbladder without cholecystitis without obstruction: Secondary | ICD-10-CM | POA: Diagnosis not present

## 2022-07-17 DIAGNOSIS — E875 Hyperkalemia: Secondary | ICD-10-CM | POA: Diagnosis not present

## 2022-07-17 DIAGNOSIS — L298 Other pruritus: Secondary | ICD-10-CM | POA: Diagnosis not present

## 2022-07-17 DIAGNOSIS — N1832 Chronic kidney disease, stage 3b: Secondary | ICD-10-CM | POA: Diagnosis not present

## 2022-07-17 DIAGNOSIS — Z7182 Exercise counseling: Secondary | ICD-10-CM | POA: Diagnosis not present

## 2022-07-17 DIAGNOSIS — E1169 Type 2 diabetes mellitus with other specified complication: Secondary | ICD-10-CM | POA: Diagnosis not present

## 2022-07-17 DIAGNOSIS — R7401 Elevation of levels of liver transaminase levels: Secondary | ICD-10-CM | POA: Diagnosis not present

## 2022-07-17 DIAGNOSIS — E1122 Type 2 diabetes mellitus with diabetic chronic kidney disease: Secondary | ICD-10-CM | POA: Diagnosis not present

## 2022-07-17 DIAGNOSIS — R030 Elevated blood-pressure reading, without diagnosis of hypertension: Secondary | ICD-10-CM | POA: Diagnosis not present

## 2022-09-05 DIAGNOSIS — E1165 Type 2 diabetes mellitus with hyperglycemia: Secondary | ICD-10-CM | POA: Diagnosis not present

## 2022-09-05 DIAGNOSIS — Z139 Encounter for screening, unspecified: Secondary | ICD-10-CM | POA: Diagnosis not present

## 2022-09-11 DIAGNOSIS — N1832 Chronic kidney disease, stage 3b: Secondary | ICD-10-CM | POA: Diagnosis not present

## 2022-09-11 DIAGNOSIS — L299 Pruritus, unspecified: Secondary | ICD-10-CM | POA: Diagnosis not present

## 2022-09-11 DIAGNOSIS — Z0000 Encounter for general adult medical examination without abnormal findings: Secondary | ICD-10-CM | POA: Diagnosis not present

## 2022-09-11 DIAGNOSIS — E118 Type 2 diabetes mellitus with unspecified complications: Secondary | ICD-10-CM | POA: Diagnosis not present

## 2022-09-11 DIAGNOSIS — R21 Rash and other nonspecific skin eruption: Secondary | ICD-10-CM | POA: Diagnosis not present

## 2022-09-11 DIAGNOSIS — E1169 Type 2 diabetes mellitus with other specified complication: Secondary | ICD-10-CM | POA: Diagnosis not present

## 2022-09-11 DIAGNOSIS — E875 Hyperkalemia: Secondary | ICD-10-CM | POA: Diagnosis not present

## 2022-09-11 DIAGNOSIS — E1165 Type 2 diabetes mellitus with hyperglycemia: Secondary | ICD-10-CM | POA: Diagnosis not present

## 2022-09-11 DIAGNOSIS — E1122 Type 2 diabetes mellitus with diabetic chronic kidney disease: Secondary | ICD-10-CM | POA: Diagnosis not present

## 2022-09-11 DIAGNOSIS — G309 Alzheimer's disease, unspecified: Secondary | ICD-10-CM | POA: Diagnosis not present

## 2022-09-11 DIAGNOSIS — K8020 Calculus of gallbladder without cholecystitis without obstruction: Secondary | ICD-10-CM | POA: Diagnosis not present

## 2022-11-14 DIAGNOSIS — H43811 Vitreous degeneration, right eye: Secondary | ICD-10-CM | POA: Diagnosis not present

## 2022-11-14 DIAGNOSIS — Z961 Presence of intraocular lens: Secondary | ICD-10-CM | POA: Diagnosis not present

## 2022-11-18 DIAGNOSIS — L281 Prurigo nodularis: Secondary | ICD-10-CM | POA: Diagnosis not present

## 2022-11-18 DIAGNOSIS — L308 Other specified dermatitis: Secondary | ICD-10-CM | POA: Diagnosis not present

## 2022-11-18 DIAGNOSIS — D225 Melanocytic nevi of trunk: Secondary | ICD-10-CM | POA: Diagnosis not present

## 2023-03-06 DIAGNOSIS — E1165 Type 2 diabetes mellitus with hyperglycemia: Secondary | ICD-10-CM | POA: Diagnosis not present

## 2023-04-07 ENCOUNTER — Ambulatory Visit
Admission: EM | Admit: 2023-04-07 | Discharge: 2023-04-07 | Disposition: A | Discharge status: Home / Self Care | Payer: HMO | Attending: Nurse Practitioner | Admitting: Nurse Practitioner

## 2023-04-07 DIAGNOSIS — J069 Acute upper respiratory infection, unspecified: Secondary | ICD-10-CM

## 2023-04-07 MED ORDER — PROMETHAZINE-DM 6.25-15 MG/5ML PO SYRP: 5.0000 mL | ORAL_SOLUTION | Freq: Every evening | ORAL | 0 refills | Status: AC | PRN

## 2023-04-07 NOTE — Discharge Instructions (Signed)
 Take medication as prescribed. Increase fluids and allow for plenty of rest. Recommend Tylenol as needed for pain, fever, or general discomfort. Recommend use of a humidifier at nighttime during sleep and sleeping elevated on pillows while cough symptoms persist. As discussed, if symptoms fail to improve, or suddenly worsen, you may follow-up in this clinic or with your primary care physician for further evaluation. Follow-up as needed.

## 2023-04-07 NOTE — ED Provider Notes (Signed)
 [facility]    CSN: [id number] Arrival date & time: 04/07/23  1344      History   Chief Complaint Chief Complaint  Patient presents with   Cough    HPI Kristina Mcpherson is a 73 y.o. female.   The history is provided by the patient.   Patient presents with her spouse for complaints of cough and chest congestion.  Spouse reports symptoms started over the past week.  He states that the patient has been taking over-the-counter cough and cold medications, took TheraFlu last evening, and states that he feels that her cough is improved today.  Patient and spouse deny fever, chills, headache, sore throat, wheezing, difficulty breathing, chest pain, abdominal pain, nausea, vomiting, diarrhea, or rash.  Patient reports that he has been sick with the same or similar symptoms.  Past Medical History:  Diagnosis Date   History of hiatal hernia    Hypertension     Patient Active Problem List   Diagnosis Date Noted   Cognitive impairment 04/04/2020   Special screening for malignant neoplasms, colon     Past Surgical History:  Procedure Laterality Date   ABDOMINAL HYSTERECTOMY     ADENOIDECTOMY     CATARACT EXTRACTION W/PHACO Left 05/09/2015   Procedure: CATARACT EXTRACTION PHACO AND INTRAOCULAR LENS PLACEMENT (IOC);  Surgeon: Oneil Platts, MD;  Location: [facility];  Service: Ophthalmology;  Laterality: Left;  CDE:4.83   CATARACT EXTRACTION W/PHACO Right 05/23/2015   Procedure: CATARACT EXTRACTION PHACO AND INTRAOCULAR LENS PLACEMENT (IOC);  Surgeon: Oneil Platts, MD;  Location: [facility];  Service: Ophthalmology;  Laterality: Right;  CDE:7.81   COLONOSCOPY N/A 06/18/2017   Procedure: COLONOSCOPY;  Surgeon: Harvey Margo CROME, MD;  Location: [facility];  Service: Endoscopy;  Laterality: N/A;  2:00   KIDNEY DONATION     KNEE ARTHROSCOPY Right    shaving bone foot Right    5th toe   TOTAL KNEE ARTHROPLASTY Right     OB History   No obstetric history on file.      Home  Medications    Prior to Admission medications   Medication Sig Start Date End Date Taking? Authorizing Provider  donepezil  (ARICEPT ) 10 MG tablet TAKE 1 TABLET BY MOUTH AT BEDTIME 04/23/21  Yes Onita Duos, MD  memantine  (NAMENDA ) 10 MG tablet Take 1 tablet by mouth twice daily 04/23/21  Yes Onita Duos, MD  promethazine -dextromethorphan (PROMETHAZINE -DM) 6.25-15 MG/5ML syrup Take 5 mLs by mouth at bedtime as needed for cough. 04/07/23  Yes Leath-Warren, Etta PARAS, NP  Alpha-Lipoic Acid 300 MG CAPS Take 1 capsule by mouth daily. Patient not taking: Reported on 04/04/2020    [provider]  Biotin 5 MG TABS Take 1 tablet by mouth daily. Patient not taking: Reported on 04/04/2020    [provider]  Borage, Borago officinalis, (BORAGE OIL PO) Take 600 mg by mouth daily. Patient not taking: Reported on 04/04/2020    [provider]  Calcium Carbonate-Vitamin D (CALTRATE 600+D PO) Take 1 tablet by mouth daily. Patient not taking: Reported on 04/04/2020    [provider]  CINNAMON PO Take 600 mg by mouth daily. Patient not taking: Reported on 04/04/2020    [provider]  doxepin  (SINEQUAN ) 10 MG capsule Take 1 capsule (10 mg total) by mouth 3 (three) times daily. Patient not taking: Reported on 04/04/2020 01/03/20   Raford Lenis, MD  Landy Nyhan, Camillia sinensis, (GREEN TEA PO) Take 500 mg by mouth daily. Patient not  taking: Reported on 04/04/2020    [provider]  losartan-hydrochlorothiazide (HYZAAR) 100-12.5 MG tablet Take 0.5 tablets by mouth 2 (two) times daily. Patient not taking: Reported on 04/04/2020    [provider]  MILK THISTLE PO Take 1 capsule by mouth daily. 80% Patient not taking: Reported on 04/04/2020    [provider]  Multiple Vitamins-Minerals (MULTIVITAMINS THER. W/MINERALS) TABS tablet Take 1 tablet by mouth daily. Patient not taking: Reported on 04/04/2020    [provider]  multivitamin-lutein  (OCUVITE-LUTEIN) CAPS capsule Take 1 capsule by mouth daily. Patient not taking: Reported on 04/04/2020    [provider]  predniSONE  (DELTASONE ) 50 MG tablet Take 1 tablet (50 mg total) by mouth daily. Patient not taking: Reported on 04/04/2020 01/03/20   Raford Lenis, MD  Probiotic Product (PROBIOTIC DAILY PO) Take 1 capsule by mouth daily. Patient not taking: Reported on 04/04/2020    [provider]    Family History Family History  Problem Relation Age of Onset   Leukemia Mother    Coronary artery disease Mother    Hypertension Mother    Hypercholesterolemia Mother    Dementia Mother    Diabetes Mother    Hypertension Father    Hypercholesterolemia Father    Transient ischemic attack Father    Coronary artery disease Brother    Dementia Maternal Grandfather     Social History Social History   Tobacco Use   Smoking status: Never   Smokeless tobacco: Never  Vaping Use   Vaping status: Never Used  Substance Use Topics   Alcohol use: Not Currently    Comment: rare   Drug use: No     Allergies   Codeine and Percocet [oxycodone-acetaminophen]   Review of Systems Review of Systems Per HPI  Physical Exam Triage Vital Signs ED Triage Vitals [04/07/23 1416]  Encounter Vitals Group     BP 138/82     Systolic BP Percentile      Diastolic BP Percentile      Pulse Rate 92     Resp 16     Temp 97.8 F (36.6 C)     Temp Source Oral     SpO2 97 %     Weight      Height      Head Circumference      Peak Flow      Pain Score 0     Pain Loc      Pain Education      Exclude from Growth Chart    No data found.  Updated Vital Signs BP 138/82 (BP Location: Right Arm)   Pulse 92   Temp 97.8 F (36.6 C) (Oral)   Resp 16   SpO2 97%   Visual Acuity Right Eye Distance:   Left Eye Distance:   Bilateral Distance:    Right Eye Near:   Left Eye Near:    Bilateral Near:     Physical Exam Vitals and nursing note reviewed.  Constitutional:       General: She is not in acute distress.    Appearance: Normal appearance.  HENT:     Head: Normocephalic.     Right Ear: Tympanic membrane, ear canal and external ear normal.     Left Ear: Tympanic membrane, ear canal and external ear normal.     Nose: Congestion present.     Mouth/Throat:     Lips: Pink.     Mouth: Mucous membranes are moist.  Pharynx: Uvula midline. Postnasal drip present. No pharyngeal swelling, oropharyngeal exudate, posterior oropharyngeal erythema or uvula swelling.     Comments: Cobblestoning present to posterior oropharynx  Eyes:     Extraocular Movements: Extraocular movements intact.     Conjunctiva/sclera: Conjunctivae normal.     Pupils: Pupils are equal, round, and reactive to light.  Cardiovascular:     Rate and Rhythm: Normal rate and regular rhythm.     Pulses: Normal pulses.     Heart sounds: Normal heart sounds.  Pulmonary:     Effort: Pulmonary effort is normal.     Breath sounds: Normal breath sounds.  Abdominal:     General: Bowel sounds are normal.     Palpations: Abdomen is soft.     Tenderness: There is no abdominal tenderness.  Musculoskeletal:     Cervical back: Normal range of motion.  Lymphadenopathy:     Cervical: No cervical adenopathy.  Neurological:     General: No focal deficit present.     Mental Status: She is alert and oriented to person, place, and time.  Psychiatric:        Mood and Affect: Mood normal.        Behavior: Behavior normal.      UC Treatments / Results  Labs (all labs ordered are listed, but only abnormal results are displayed) Labs Reviewed - No data to display  EKG   Radiology No results found.  Procedures Procedures (including critical care time)  Medications Ordered in UC Medications - No data to display  Initial Impression / Assessment and Plan / UC Course  I have reviewed the triage vital signs and the nursing notes.  Pertinent labs & imaging results that were available during  my care of the patient were reviewed by me and considered in my medical decision making (see chart for details).  On exam, lung sounds are clear throughout, room air sats at 97%.  Viral testing not indicated based on the duration of the patient's symptoms.  Suspect patient has a viral URI with cough.  Family reports symptoms have improved over the past 24 hours.  Will provide symptomatic treatment with Promethazine  DM for cough for nighttime.  Supportive care recommendations were provided and discussed with the patient and her spouse to include fluids, rest, use of a humidifier at nighttime during sleep, and over-the-counter analgesics.  Discussed indications regarding follow-up.  Patient and spouse were in agreement with this plan of care and verbalized understanding.  All questions were answered.  Patient stable for discharge.  Final Clinical Impressions(s) / UC Diagnoses   Final diagnoses:  Viral URI with cough     Discharge Instructions      Take medication as prescribed. Increase fluids and allow for plenty of rest. Recommend Tylenol as needed for pain, fever, or general discomfort. Recommend use of a humidifier at nighttime during sleep and sleeping elevated on pillows while cough symptoms persist. As discussed, if symptoms fail to improve, or suddenly worsen, you may follow-up in this clinic or with your primary care physician for further evaluation. Follow-up as needed.      ED Prescriptions     Medication Sig Dispense Auth. Provider   promethazine -dextromethorphan (PROMETHAZINE -DM) 6.25-15 MG/5ML syrup Take 5 mLs by mouth at bedtime as needed for cough. 118 mL Leath-Warren, Etta PARAS, NP      PDMP not reviewed this encounter.   Gilmer Etta PARAS, NP 04/07/23 1440

## 2023-04-07 NOTE — ED Triage Notes (Signed)
 Cough, congestion x  1 week. Taking mucinex.

## 2023-09-11 DIAGNOSIS — E1169 Type 2 diabetes mellitus with other specified complication: Secondary | ICD-10-CM | POA: Diagnosis not present

## 2023-09-15 DIAGNOSIS — K8020 Calculus of gallbladder without cholecystitis without obstruction: Secondary | ICD-10-CM | POA: Diagnosis not present

## 2023-09-15 DIAGNOSIS — R7401 Elevation of levels of liver transaminase levels: Secondary | ICD-10-CM | POA: Diagnosis not present

## 2023-09-15 DIAGNOSIS — H6121 Impacted cerumen, right ear: Secondary | ICD-10-CM | POA: Diagnosis not present

## 2023-09-15 DIAGNOSIS — E1165 Type 2 diabetes mellitus with hyperglycemia: Secondary | ICD-10-CM | POA: Diagnosis not present

## 2023-09-15 DIAGNOSIS — F0280 Dementia in other diseases classified elsewhere without behavioral disturbance: Secondary | ICD-10-CM | POA: Diagnosis not present

## 2023-09-15 DIAGNOSIS — R008 Other abnormalities of heart beat: Secondary | ICD-10-CM | POA: Diagnosis not present

## 2023-09-15 DIAGNOSIS — G309 Alzheimer's disease, unspecified: Secondary | ICD-10-CM | POA: Diagnosis not present

## 2023-09-15 DIAGNOSIS — R030 Elevated blood-pressure reading, without diagnosis of hypertension: Secondary | ICD-10-CM | POA: Diagnosis not present

## 2023-09-15 DIAGNOSIS — H7292 Unspecified perforation of tympanic membrane, left ear: Secondary | ICD-10-CM | POA: Diagnosis not present

## 2023-09-15 DIAGNOSIS — N1832 Chronic kidney disease, stage 3b: Secondary | ICD-10-CM | POA: Diagnosis not present

## 2023-09-15 DIAGNOSIS — E1169 Type 2 diabetes mellitus with other specified complication: Secondary | ICD-10-CM | POA: Diagnosis not present

## 2023-09-15 DIAGNOSIS — E875 Hyperkalemia: Secondary | ICD-10-CM | POA: Diagnosis not present

## 2023-12-18 DIAGNOSIS — E1165 Type 2 diabetes mellitus with hyperglycemia: Secondary | ICD-10-CM | POA: Diagnosis not present

## 2023-12-24 DIAGNOSIS — K8020 Calculus of gallbladder without cholecystitis without obstruction: Secondary | ICD-10-CM | POA: Diagnosis not present

## 2023-12-24 DIAGNOSIS — R008 Other abnormalities of heart beat: Secondary | ICD-10-CM | POA: Diagnosis not present

## 2023-12-24 DIAGNOSIS — E875 Hyperkalemia: Secondary | ICD-10-CM | POA: Diagnosis not present

## 2023-12-24 DIAGNOSIS — Z0000 Encounter for general adult medical examination without abnormal findings: Secondary | ICD-10-CM | POA: Diagnosis not present

## 2023-12-24 DIAGNOSIS — N1832 Chronic kidney disease, stage 3b: Secondary | ICD-10-CM | POA: Diagnosis not present

## 2023-12-24 DIAGNOSIS — G309 Alzheimer's disease, unspecified: Secondary | ICD-10-CM | POA: Diagnosis not present

## 2023-12-24 DIAGNOSIS — E1165 Type 2 diabetes mellitus with hyperglycemia: Secondary | ICD-10-CM | POA: Diagnosis not present

## 2023-12-24 DIAGNOSIS — R7401 Elevation of levels of liver transaminase levels: Secondary | ICD-10-CM | POA: Diagnosis not present

## 2023-12-24 DIAGNOSIS — R030 Elevated blood-pressure reading, without diagnosis of hypertension: Secondary | ICD-10-CM | POA: Diagnosis not present

## 2023-12-24 DIAGNOSIS — Z524 Kidney donor: Secondary | ICD-10-CM | POA: Diagnosis not present

## 2023-12-24 DIAGNOSIS — E1169 Type 2 diabetes mellitus with other specified complication: Secondary | ICD-10-CM | POA: Diagnosis not present
